# Patient Record
Sex: Female | Born: 2000 | Race: Black or African American | Hispanic: No | Marital: Single | Smoking: Never smoker
Health system: Southern US, Community
[De-identification: ages and names within clinical notes are randomized; demographics above are authoritative.]

## PROBLEM LIST (undated history)

## (undated) DIAGNOSIS — D649 Anemia, unspecified: Secondary | ICD-10-CM

## (undated) DIAGNOSIS — Z789 Other specified health status: Secondary | ICD-10-CM

## (undated) HISTORY — PX: NO PAST SURGERIES: SHX2092

## (undated) HISTORY — DX: Anemia, unspecified: D64.9

## (undated) HISTORY — DX: Other specified health status: Z78.9

---
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
## 2019-01-06 ENCOUNTER — Other Ambulatory Visit (HOSPITAL_COMMUNITY)
Admission: RE | Admit: 2019-01-06 | Discharge: 2019-01-06 | Disposition: A | Discharge status: Home / Self Care | Payer: Medicaid Other | Source: Ambulatory Visit | Attending: Obstetrics and Gynecology | Admitting: Obstetrics and Gynecology

## 2019-01-06 ENCOUNTER — Ambulatory Visit (INDEPENDENT_AMBULATORY_CARE_PROVIDER_SITE_OTHER): Payer: Medicaid Other | Admitting: Obstetrics and Gynecology

## 2019-01-06 ENCOUNTER — Encounter: Payer: Self-pay | Admitting: Obstetrics and Gynecology

## 2019-01-06 ENCOUNTER — Other Ambulatory Visit: Payer: Self-pay

## 2019-01-06 VITALS — BP 114/71 | HR 98 | Wt 207.9 lb

## 2019-01-06 DIAGNOSIS — Z148 Genetic carrier of other disease: Secondary | ICD-10-CM

## 2019-01-06 DIAGNOSIS — D509 Iron deficiency anemia, unspecified: Secondary | ICD-10-CM | POA: Diagnosis not present

## 2019-01-06 DIAGNOSIS — Z3A36 36 weeks gestation of pregnancy: Secondary | ICD-10-CM

## 2019-01-06 DIAGNOSIS — Z362 Encounter for other antenatal screening follow-up: Secondary | ICD-10-CM

## 2019-01-06 DIAGNOSIS — Z3403 Encounter for supervision of normal first pregnancy, third trimester: Secondary | ICD-10-CM | POA: Insufficient documentation

## 2019-01-06 DIAGNOSIS — O99013 Anemia complicating pregnancy, third trimester: Secondary | ICD-10-CM

## 2019-01-06 DIAGNOSIS — O0932 Supervision of pregnancy with insufficient antenatal care, second trimester: Secondary | ICD-10-CM

## 2019-01-06 DIAGNOSIS — O0933 Supervision of pregnancy with insufficient antenatal care, third trimester: Secondary | ICD-10-CM

## 2019-01-06 DIAGNOSIS — O99019 Anemia complicating pregnancy, unspecified trimester: Secondary | ICD-10-CM

## 2019-01-06 NOTE — Progress Notes (Signed)
ROB GBS 

## 2019-01-06 NOTE — Patient Instructions (Signed)
Use the following websites (and others) to help learn more about your contraception options and find the method that is right for you!  - The Centers for Disease Control (CDC) website: https://www.cdc.gov/reproductivehealth/contraception/index.htm  - [facility] website: https://www.plannedparenthood.org/learn/birth-control  - Bedsider.org: https://www.bedsider.org/methods   Go to bedsider.org for more information!  Contraception Choices Contraception, also called birth control, refers to methods or devices that prevent pregnancy. Hormonal methods Contraceptive implant A contraceptive implant is a thin, plastic tube that contains a hormone. It is inserted into the upper part of the arm. It can remain in place for up to 3 years. Progestin-only injections Progestin-only injections are injections of progestin, a synthetic form of the hormone progesterone. They are given every 3 months by a health care provider. Birth control pills Birth control pills are pills that contain hormones that prevent pregnancy. They must be taken once a day, preferably at the same time each day. Birth control patch The birth control patch contains hormones that prevent pregnancy. It is placed on the skin and must be changed once a week for three weeks and removed on the fourth week. A prescription is needed to use this method of contraception. Vaginal ring A vaginal ring contains hormones that prevent pregnancy. It is placed in the vagina for three weeks and removed on the fourth week. After that, the process is repeated with a new ring. A prescription is needed to use this method of contraception. Emergency contraceptive Emergency contraceptives prevent pregnancy after unprotected sex. They come in pill form and can be taken up to 5 days after sex. They work best the sooner they are taken after having sex. Most emergency contraceptives are available without a prescription. This method should not be used as  your only form of birth control. Barrier methods Female condom A female condom is a thin sheath that is worn over the penis during sex. Condoms keep sperm from going inside a woman's body. They can be used with a spermicide to increase their effectiveness. They should be disposed after a single use. Female condom A female condom is a soft, loose-fitting sheath that is put into the vagina before sex. The condom keeps sperm from going inside a woman's body. They should be disposed after a single use.  Intrauterine contraception Intrauterine device (IUD) An IUD is a T-shaped device that is put in a woman's uterus. There are two types:  Hormone IUD.This type contains progestin, a synthetic form of the hormone progesterone. This type can stay in place for 3-5 years.  Copper IUD.This type is wrapped in copper wire. It can stay in place for 10 years.  Permanent methods of contraception Female tubal ligation In this method, a woman's fallopian tubes are sealed, tied, or blocked during surgery to prevent eggs from traveling to the uterus.  Female sterilization This is a procedure to tie off the tubes that carry sperm (vasectomy). After the procedure, the man can still ejaculate fluid (semen).  Summary  Contraception, also called birth control, means methods or devices that prevent pregnancy.  Hormonal methods of contraception include implants, injections, pills, patches, vaginal rings, and emergency contraceptives.  Barrier methods of contraception can include female condoms, female condoms, diaphragms, cervical caps, sponges, and spermicides.  There are two types of IUDs (intrauterine devices). An IUD can be put in a woman's uterus to prevent pregnancy for 3-5 years.  Permanent sterilization can be done through a procedure for males, females, or both. This information is not intended to replace advice given to you   by your health care provider. Make sure you discuss any questions you have with your  health care provider. Document Released: 01/16/2005 Document Revised: 02/19/2016 Document Reviewed: 02/19/2016 Elsevier Interactive Patient Education  2018 Elsevier Inc.  

## 2019-01-06 NOTE — Progress Notes (Signed)
   PRENATAL VISIT NOTE  Subjective:  Brianna Mercado is a 18 y.o. G1P0 at [redacted]w[redacted]d being seen today for ongoing prenatal care.  She is currently monitored for the following issues for this low-risk pregnancy and has Supervision of normal first teen pregnancy; Iron deficiency anemia during pregnancy; Late prenatal care affecting pregnancy in second trimester; and Genetic carrier on their problem list.  Patient reports no complaints.  Contractions: Irritability. Vag. Bleeding: None.  Movement: Present. Denies leaking of fluid.   The following portions of the patient's history were reviewed and updated as appropriate: allergies, current medications, past family history, past medical history, past social history, past surgical history and problem list.   Objective:   Vitals:   01/06/19 1129  BP: 114/71  Pulse: 98  Weight: 207 lb 14.4 oz (94.3 kg)   Fetal Status: Fetal Heart Rate (bpm): 147   Movement: Present     General:  Alert, oriented and cooperative. Patient is in no acute distress.  Skin: Skin is warm and dry. No rash noted.   Cardiovascular: Normal heart rate noted  Respiratory: Normal respiratory effort, no problems with respiration noted  Abdomen: Soft, gravid, appropriate for gestational age.  Pain/Pressure: Present     Pelvic: Cervical exam deferred        Extremities: Normal range of motion.  Edema: None  Mental Status: Normal mood and affect. Normal behavior. Normal judgment and thought content.   Pt informed that the ultrasound is considered a limited OB ultrasound and is not intended to be a complete ultrasound exam.  Patient also informed that the ultrasound is not being completed with the intent of assessing for fetal or placental anomalies or any pelvic abnormalities.  Explained that the purpose of today's ultrasound is to assess for  presentation.  Patient acknowledges the purpose of the exam and the limitations of the study.    Bedside US: cephalic  Assessment and Plan:   Pregnancy: G1P0 at [redacted]w[redacted]d  1. Supervision of normal first teen pregnancy in third trimester - Strep Gp B NAA - Cervicovaginal ancillary only( [place]) - had extensive conversation regarding contraception options, gave info  2. Iron deficiency anemia during pregnancy  3. Late prenatal care affecting pregnancy in second trimester  4. Genetic carrier Carrier for [place]   Preterm labor symptoms and general obstetric precautions including but not limited to vaginal bleeding, contractions, leaking of fluid and fetal movement were reviewed in detail with the patient. Please refer to After Visit Summary for other counseling recommendations.   Return in about 1 week (around 01/13/2019) for low OB, virtual.  No future appointments.  Sloan Leiter, MD

## 2019-01-07 LAB — CERVICOVAGINAL ANCILLARY ONLY
Chlamydia: NEGATIVE
Comment: NEGATIVE
Comment: NORMAL
Neisseria Gonorrhea: NEGATIVE

## 2019-01-08 LAB — STREP GP B NAA: Strep Gp B NAA: POSITIVE — AB

## 2019-01-13 ENCOUNTER — Encounter: Payer: Self-pay | Admitting: Obstetrics and Gynecology

## 2019-01-13 ENCOUNTER — Telehealth (INDEPENDENT_AMBULATORY_CARE_PROVIDER_SITE_OTHER): Payer: Medicaid Other | Admitting: Obstetrics and Gynecology

## 2019-01-13 DIAGNOSIS — Z3A37 37 weeks gestation of pregnancy: Secondary | ICD-10-CM

## 2019-01-13 DIAGNOSIS — Z3403 Encounter for supervision of normal first pregnancy, third trimester: Secondary | ICD-10-CM

## 2019-01-13 DIAGNOSIS — O99019 Anemia complicating pregnancy, unspecified trimester: Secondary | ICD-10-CM

## 2019-01-13 DIAGNOSIS — O99013 Anemia complicating pregnancy, third trimester: Secondary | ICD-10-CM

## 2019-01-13 DIAGNOSIS — Z148 Genetic carrier of other disease: Secondary | ICD-10-CM

## 2019-01-13 DIAGNOSIS — D509 Iron deficiency anemia, unspecified: Secondary | ICD-10-CM

## 2019-01-13 NOTE — Progress Notes (Signed)
Pt is on the phone preparing for virtual visit with provider. Pt reports her BP cuff is broken and she is not able to check her BP today. Pt denies any HA's or blurry vision.

## 2019-01-13 NOTE — Progress Notes (Signed)
   TELEHEALTH OBSTETRICS PRENATAL VIRTUAL VIDEO VISIT ENCOUNTER NOTE  Provider location: [facility] for [facility] at [place]   I connected with Kathaleen Bury on 01/13/19 at  9:00 AM EST by MyChart Video Encounter at home and verified that I am speaking with the correct person using two identifiers.   I discussed the limitations, risks, security and privacy concerns of performing an evaluation and management service virtually and the availability of in person appointments. I also discussed with the patient that there may be a patient responsible charge related to this service. The patient expressed understanding and agreed to proceed. Subjective:  Brianna Mercado is a 18 y.o. G1P0 at [redacted]w[redacted]d being seen today for ongoing prenatal care.  She is currently monitored for the following issues for this low-risk pregnancy and has Supervision of normal first teen pregnancy; Iron deficiency anemia during pregnancy; Late prenatal care affecting pregnancy in second trimester; and Genetic carrier on their problem list.  Patient reports no complaints.  Contractions: Irritability. Vag. Bleeding: None.  Movement: Present. Denies any leaking of fluid.   The following portions of the patient's history were reviewed and updated as appropriate: allergies, current medications, past family history, past medical history, past social history, past surgical history and problem list.   Objective:  There were no vitals filed for this visit.  Fetal Status:     Movement: Present     General:  Alert, oriented and cooperative. Patient is in no acute distress.  Respiratory: Normal respiratory effort, no problems with respiration noted  Mental Status: Normal mood and affect. Normal behavior. Normal judgment and thought content.  Rest of physical exam deferred due to type of encounter  Imaging: No results found.  Assessment and Plan:  Pregnancy: G1P0 at [redacted]w[redacted]d 1. Supervision of normal first teen pregnancy in third  trimester Patient is doing well without complaints Patient declines contraception  2. Genetic carrier See by genetic counselor  3. Iron deficiency anemia during pregnancy Continue iron supplement  Term labor symptoms and general obstetric precautions including but not limited to vaginal bleeding, contractions, leaking of fluid and fetal movement were reviewed in detail with the patient. I discussed the assessment and treatment plan with the patient. The patient was provided an opportunity to ask questions and all were answered. The patient agreed with the plan and demonstrated an understanding of the instructions. The patient was advised to call back or seek an in-person office evaluation/go to [facility]U at [facility] for any urgent or concerning symptoms. Please refer to After Visit Summary for other counseling recommendations.   I provided 11 minutes of face-to-face time during this encounter.  No follow-ups on file.  No future appointments.  Mora Bellman, MD Center for [place]

## 2019-01-20 ENCOUNTER — Telehealth (INDEPENDENT_AMBULATORY_CARE_PROVIDER_SITE_OTHER): Payer: Medicaid Other | Admitting: Advanced Practice Midwife

## 2019-01-20 ENCOUNTER — Other Ambulatory Visit: Payer: Self-pay

## 2019-01-20 DIAGNOSIS — O99820 Streptococcus B carrier state complicating pregnancy: Secondary | ICD-10-CM

## 2019-01-20 DIAGNOSIS — Z3403 Encounter for supervision of normal first pregnancy, third trimester: Secondary | ICD-10-CM

## 2019-01-20 DIAGNOSIS — Z3A38 38 weeks gestation of pregnancy: Secondary | ICD-10-CM

## 2019-01-20 NOTE — Progress Notes (Signed)
   TELEHEALTH OBSTETRICS PRENATAL VIRTUAL VIDEO VISIT ENCOUNTER NOTE  Provider location: [facility] for [facility] at [place]   I connected with Brianna Mercado on 01/20/19 at  1:55 PM EST by MyChart Video Encounter at home and verified that I am speaking with the correct person using two identifiers.   I discussed the limitations, risks, security and privacy concerns of performing an evaluation and management service virtually and the availability of in person appointments. I also discussed with the patient that there may be a patient responsible charge related to this service. The patient expressed understanding and agreed to proceed. Subjective:  Brianna Mercado is a 18 y.o. G1P0 at [redacted]w[redacted]d being seen today for ongoing prenatal care.  She is currently monitored for the following issues for this low-risk pregnancy and has Supervision of normal first teen pregnancy; Iron deficiency anemia during pregnancy; Late prenatal care affecting pregnancy in second trimester; and Genetic carrier on their problem list.  Patient reports no complaints.  Contractions: Irritability. Vag. Bleeding: None.  Movement: Present. Denies any leaking of fluid.   The following portions of the patient's history were reviewed and updated as appropriate: allergies, current medications, past family history, past medical history, past social history, past surgical history and problem list.   Objective:  There were no vitals filed for this visit.  Fetal Status:     Movement: Present     General:  Alert, oriented and cooperative. Patient is in no acute distress.  Respiratory: Normal respiratory effort, no problems with respiration noted  Mental Status: Normal mood and affect. Normal behavior. Normal judgment and thought content.  Rest of physical exam deferred due to type of encounter  Imaging: No results found.  Assessment and Plan:  Pregnancy: G1P0 at [redacted]w[redacted]d 1. Supervision of normal first teen pregnancy in third  trimester --Pt reports good fetal movement, denies cramping, LOF, or vaginal bleeding --Anticipatory guidance about next visits/weeks of pregnancy given. --Questions answered about circumcision and obtaining breast pump --Discussed cervical ripening/labor readiness options including evening primrose oil, raspberry leaf tea, intercourse, and the [employer].  Pt states understanding. --Next visit at 39 weeks in office for exam.  Will schedule postdates IOL at that visit if needed.  2. GBS (group B Streptococcus carrier), +RV culture, currently pregnant  Term labor symptoms and general obstetric precautions including but not limited to vaginal bleeding, contractions, leaking of fluid and fetal movement were reviewed in detail with the patient. I discussed the assessment and treatment plan with the patient. The patient was provided an opportunity to ask questions and all were answered. The patient agreed with the plan and demonstrated an understanding of the instructions. The patient was advised to call back or seek an in-person office evaluation/go to MAU at [facility] for any urgent or concerning symptoms. Please refer to After Visit Summary for other counseling recommendations.   I provided 15  minutes of face-to-face time during this encounter.  No follow-ups on file.  Future Appointments  Date Time Provider [place]  01/20/2019  1:55 PM Left[place]-Kir[place], Kathie Dike, CNM [facility] None    Fatima Blank, [place] for [facility], [place]

## 2019-01-25 ENCOUNTER — Inpatient Hospital Stay (HOSPITAL_COMMUNITY): Payer: Medicaid Other | Admitting: Anesthesiology

## 2019-01-25 ENCOUNTER — Encounter (HOSPITAL_COMMUNITY): Payer: Self-pay | Admitting: Obstetrics and Gynecology

## 2019-01-25 ENCOUNTER — Inpatient Hospital Stay (HOSPITAL_COMMUNITY)
Admission: AD | Admit: 2019-01-25 | Discharge: 2019-01-28 | DRG: 807 | Disposition: A | Discharge status: Home / Self Care | Payer: Medicaid Other | Attending: Obstetrics & Gynecology | Admitting: Obstetrics & Gynecology

## 2019-01-25 ENCOUNTER — Other Ambulatory Visit: Payer: Self-pay

## 2019-01-25 DIAGNOSIS — Z3A39 39 weeks gestation of pregnancy: Secondary | ICD-10-CM

## 2019-01-25 DIAGNOSIS — O368130 Decreased fetal movements, third trimester, not applicable or unspecified: Secondary | ICD-10-CM | POA: Diagnosis present

## 2019-01-25 DIAGNOSIS — O99824 Streptococcus B carrier state complicating childbirth: Secondary | ICD-10-CM | POA: Diagnosis present

## 2019-01-25 DIAGNOSIS — Z20828 Contact with and (suspected) exposure to other viral communicable diseases: Secondary | ICD-10-CM | POA: Diagnosis present

## 2019-01-25 LAB — TYPE AND SCREEN
ABO/RH(D): AB POS
Antibody Screen: NEGATIVE

## 2019-01-25 LAB — CBC
HCT: 37 % (ref 36.0–46.0)
Hemoglobin: 11.4 g/dL — ABNORMAL LOW (ref 12.0–15.0)
MCH: 24.8 pg — ABNORMAL LOW (ref 26.0–34.0)
MCHC: 30.8 g/dL (ref 30.0–36.0)
MCV: 80.6 fL (ref 80.0–100.0)
Platelets: 292 10*3/uL (ref 150–400)
RBC: 4.59 MIL/uL (ref 3.87–5.11)
RDW: 16 % — ABNORMAL HIGH (ref 11.5–15.5)
WBC: 11.2 10*3/uL — ABNORMAL HIGH (ref 4.0–10.5)
nRBC: 0 % (ref 0.0–0.2)

## 2019-01-25 LAB — SARS CORONAVIRUS 2 (TAT 6-24 HRS): SARS Coronavirus 2: NEGATIVE

## 2019-01-25 LAB — ABO/RH: ABO/RH(D): AB POS

## 2019-01-25 LAB — RPR: RPR Ser Ql: NONREACTIVE

## 2019-01-25 MED ORDER — PHENYLEPHRINE 40 MCG/ML (10ML) SYRINGE FOR IV PUSH (FOR BLOOD PRESSURE SUPPORT)
80.0000 ug | PREFILLED_SYRINGE | INTRAVENOUS | Status: DC | PRN
  Filled 2019-01-25: qty 10

## 2019-01-25 MED ORDER — DIPHENHYDRAMINE HCL 50 MG/ML IJ SOLN: 12.5000 mg | INTRAMUSCULAR | Status: DC | PRN

## 2019-01-25 MED ORDER — TERBUTALINE SULFATE 1 MG/ML IJ SOLN: 0.2500 mg | Freq: Once | INTRAMUSCULAR | Status: DC | PRN

## 2019-01-25 MED ORDER — OXYTOCIN 40 UNITS IN NORMAL SALINE INFUSION - SIMPLE MED
1.0000 m[IU]/min | INTRAVENOUS | Status: DC
  Administered 2019-01-25: 20:00:00 2 m[IU]/min via INTRAVENOUS
  Administered 2019-01-26: 8 m[IU]/min via INTRAVENOUS
  Filled 2019-01-25: qty 1000

## 2019-01-25 MED ORDER — EPHEDRINE 5 MG/ML INJ: 10.0000 mg | INTRAVENOUS | Status: DC | PRN

## 2019-01-25 MED ORDER — LACTATED RINGERS IV SOLN
500.0000 mL | Freq: Once | INTRAVENOUS | Status: AC
  Administered 2019-01-25: 500 mL via INTRAVENOUS

## 2019-01-25 MED ORDER — OXYCODONE-ACETAMINOPHEN 5-325 MG PO TABS: 1.0000 | ORAL_TABLET | ORAL | Status: DC | PRN

## 2019-01-25 MED ORDER — PHENYLEPHRINE 40 MCG/ML (10ML) SYRINGE FOR IV PUSH (FOR BLOOD PRESSURE SUPPORT): 80.0000 ug | PREFILLED_SYRINGE | INTRAVENOUS | Status: DC | PRN

## 2019-01-25 MED ORDER — SOD CITRATE-CITRIC ACID 500-334 MG/5ML PO SOLN: 30.0000 mL | ORAL | Status: DC | PRN

## 2019-01-25 MED ORDER — LACTATED RINGERS IV SOLN
INTRAVENOUS | Status: DC
  Administered 2019-01-25: 16:00:00 125 mL/h via INTRAVENOUS

## 2019-01-25 MED ORDER — SODIUM CHLORIDE 0.9 % IV SOLN
5.0000 10*6.[IU] | Freq: Once | INTRAVENOUS | Status: AC
  Administered 2019-01-25: 12:00:00 5 10*6.[IU] via INTRAVENOUS
  Filled 2019-01-25: qty 5

## 2019-01-25 MED ORDER — SODIUM CHLORIDE (PF) 0.9 % IJ SOLN
INTRAMUSCULAR | Status: DC | PRN
  Administered 2019-01-25: 12 mL/h via EPIDURAL

## 2019-01-25 MED ORDER — ONDANSETRON HCL 4 MG/2ML IJ SOLN: 4.0000 mg | Freq: Four times a day (QID) | INTRAMUSCULAR | Status: DC | PRN

## 2019-01-25 MED ORDER — FENTANYL-BUPIVACAINE-NACL 0.5-0.125-0.9 MG/250ML-% EP SOLN
12.0000 mL/h | EPIDURAL | Status: DC | PRN
  Filled 2019-01-25: qty 250

## 2019-01-25 MED ORDER — OXYCODONE-ACETAMINOPHEN 5-325 MG PO TABS: 2.0000 | ORAL_TABLET | ORAL | Status: DC | PRN

## 2019-01-25 MED ORDER — OXYTOCIN BOLUS FROM INFUSION
500.0000 mL | Freq: Once | INTRAVENOUS | Status: AC
  Administered 2019-01-26: 04:00:00 500 mL via INTRAVENOUS

## 2019-01-25 MED ORDER — PENICILLIN G POT IN DEXTROSE 60000 UNIT/ML IV SOLN
3.0000 10*6.[IU] | INTRAVENOUS | Status: DC
  Administered 2019-01-25 (×3): 3 10*6.[IU] via INTRAVENOUS
  Filled 2019-01-25 (×3): qty 50

## 2019-01-25 MED ORDER — LACTATED RINGERS IV SOLN
500.0000 mL | INTRAVENOUS | Status: DC | PRN
  Administered 2019-01-26: 500 mL via INTRAVENOUS

## 2019-01-25 MED ORDER — LACTATED RINGERS IV SOLN: 500.0000 mL | Freq: Once | INTRAVENOUS | Status: DC

## 2019-01-25 MED ORDER — MISOPROSTOL 50MCG HALF TABLET
50.0000 ug | ORAL_TABLET | Freq: Four times a day (QID) | ORAL | Status: DC | PRN
  Administered 2019-01-25 (×2): 50 ug via BUCCAL
  Filled 2019-01-25 (×2): qty 1

## 2019-01-25 MED ORDER — FENTANYL-BUPIVACAINE-NACL 0.5-0.125-0.9 MG/250ML-% EP SOLN: 12.0000 mL/h | EPIDURAL | Status: DC | PRN

## 2019-01-25 MED ORDER — ACETAMINOPHEN 325 MG PO TABS: 650.0000 mg | ORAL_TABLET | ORAL | Status: DC | PRN

## 2019-01-25 MED ORDER — OXYTOCIN 40 UNITS IN NORMAL SALINE INFUSION - SIMPLE MED: 2.5000 [IU]/h | INTRAVENOUS | Status: DC

## 2019-01-25 MED ORDER — LIDOCAINE HCL (PF) 1 % IJ SOLN
30.0000 mL | INTRAMUSCULAR | Status: AC | PRN
  Administered 2019-01-25: 21:00:00 5 mL via SUBCUTANEOUS

## 2019-01-25 MED ORDER — FENTANYL CITRATE (PF) 100 MCG/2ML IJ SOLN
100.0000 ug | INTRAMUSCULAR | Status: DC | PRN
  Administered 2019-01-25 (×2): 100 ug via INTRAVENOUS
  Filled 2019-01-25 (×2): qty 2

## 2019-01-25 NOTE — Anesthesia Preprocedure Evaluation (Signed)
Anesthesia Evaluation  Patient identified by MRN, date of birth, ID band Patient awake    Reviewed: Allergy & Precautions, Patient's Chart, lab work & pertinent test results  Airway Mallampati: II       Dental no notable dental hx.    Pulmonary    Pulmonary exam normal        Cardiovascular negative cardio ROS Normal cardiovascular exam     Neuro/Psych negative neurological ROS  negative psych ROS   GI/Hepatic   Endo/Other    Renal/GU      Musculoskeletal   Abdominal   Peds  Hematology   Anesthesia Other Findings   Reproductive/Obstetrics (+) Pregnancy                             Anesthesia Physical Anesthesia Plan  ASA: II  Anesthesia Plan: Epidural   Post-op Pain Management:    Induction:   PONV Risk Score and Plan:   Airway Management Planned: Natural Airway  Additional Equipment:   Intra-op Plan:   Post-operative Plan:   Informed Consent: I have reviewed the patients History and Physical, chart, labs and discussed the procedure including the risks, benefits and alternatives for the proposed anesthesia with the patient or authorized representative who has indicated his/her understanding and acceptance.       Plan Discussed with:   Anesthesia Plan Comments: (Lab Results      Component                Value               Date                      WBC                      11.2 (H)            01/25/2019                HGB                      11.4 (L)            01/25/2019                HCT                      37.0                01/25/2019                MCV                      80.6                01/25/2019                PLT                      292                 01/25/2019           )        Anesthesia Quick Evaluation

## 2019-01-25 NOTE — MAU Note (Signed)
BS US performed by Aviva Kluver, Vertex presentation confirmed

## 2019-01-25 NOTE — Anesthesia Procedure Notes (Signed)
Epidural Patient location during procedure: OB Start time: 01/25/2019 8:55 PM End time: 01/25/2019 9:01 PM  Staffing Anesthesiologist: Effie Berkshire, MD Performed: anesthesiologist   Preanesthetic Checklist Completed: patient identified, IV checked, site marked, risks and benefits discussed, surgical consent, monitors and equipment checked, pre-op evaluation and timeout performed  Epidural Patient position: sitting Prep: ChloraPrep Patient monitoring: heart rate, continuous pulse ox and blood pressure Approach: midline Location: L3-L4 Injection technique: LOR saline  Needle:  Needle type: Tuohy  Needle gauge: 17 G Needle length: 9 cm Catheter type: closed end flexible Catheter size: 20 Guage Test dose: negative and 1.5% lidocaine  Assessment Events: blood not aspirated, injection not painful, no injection resistance and no paresthesia  Additional Notes LOR @ 5.5  Patient identified. Risks/Benefits/Options discussed with patient including but not limited to bleeding, infection, nerve damage, paralysis, failed block, incomplete pain control, headache, blood pressure changes, nausea, vomiting, reactions to medications, itching and postpartum back pain. Confirmed with bedside nurse the patient's most recent platelet count. Confirmed with patient that they are not currently taking any anticoagulation, have any bleeding history or any family history of bleeding disorders. Patient expressed understanding and wished to proceed. All questions were answered. Sterile technique was used throughout the entire procedure. Please see nursing notes for vital signs. Test dose was given through epidural catheter and negative prior to continuing to dose epidural or start infusion. Warning signs of high block given to the patient including shortness of breath, tingling/numbness in hands, complete motor block, or any concerning symptoms with instructions to call for help. Patient was given instructions  on fall risk and not to get out of bed. All questions and concerns addressed with instructions to call with any issues or inadequate analgesia.    Reason for block:procedure for pain

## 2019-01-25 NOTE — Progress Notes (Signed)
   Monserratt Knezevic is a 18 y.o. G1P0 at [redacted]w[redacted]d  admitted for induction of labor due to decreased fetal movements. .  Subjective: Coping well; has not had second dose of cytotec.   Objective: Vitals:   01/25/19 0945 01/25/19 1344 01/25/19 1554 01/25/19 1709  BP: 119/70 112/66 113/64 112/64  Pulse: 81 89 98 84  Resp: 18 18 16 16   Temp: 98.7 F (37.1 C) 98.8 F (37.1 C)    TempSrc: Oral Oral    SpO2:      Weight:      Height:       No intake/output data recorded.  FHT:  FHR: 140 bpm, variability: moderate,  accelerations:  Present,  decelerations:  Present occasional variables.  UC:   irregular, every 2 minutes SVE:   Dilation: 1 Effacement (%): 50 Station: -3 Exam by:: CNM Nehemie Casserly  Labs: Lab Results  Component Value Date   WBC 11.2 (H) 01/25/2019   HGB 11.4 (L) 01/25/2019   HCT 37.0 01/25/2019   MCV 80.6 01/25/2019   PLT 292 01/25/2019    Assessment / Plan: IOL in early labor; FB still in place. RN to place second dose of cytotec.   Labor: early labor Fetal Wellbeing:  Category I Pain Control:  Labor support without medications Anticipated MOD:  NSVD  Starr Lake 01/25/2019, 5:35 PM

## 2019-01-25 NOTE — MAU Note (Addendum)
Pt is G1P0 at [redacted]w[redacted]d who has been contracting every 5 mins since 0500. Denies LOF or VB, feels decreased fetal movement. Rating strongest cntx 6/10.  Denies N/V/D.

## 2019-01-25 NOTE — Progress Notes (Signed)
   Brianna Mercado is a 18 y.o. G1P0 at [redacted]w[redacted]d  admitted for induction of labor due to decreased fetal movements.   Subjective: Coping well; comfortable after FB placement and cytotec buccally.   Objective: Vitals:   01/25/19 0945 01/25/19 1344 01/25/19 1554 01/25/19 1709  BP: 119/70 112/66 113/64 112/64  Pulse: 81 89 98 84  Resp: 18 18 16 16   Temp: 98.7 F (37.1 C) 98.8 F (37.1 C)    TempSrc: Oral Oral    SpO2:      Weight:      Height:       No intake/output data recorded.  FHT:  FHR: 140 bpm, variability: moderate,  accelerations:  Present,  decelerations:  Absent UC:   none SVE:   Dilation: 1 Effacement (%): 50 Station: -3 Exam by:: CNM Khole Arterburn Pitocin @ 0 mu/min  Labs: Lab Results  Component Value Date   WBC 11.2 (H) 01/25/2019   HGB 11.4 (L) 01/25/2019   HCT 37.0 01/25/2019   MCV 80.6 01/25/2019   PLT 292 01/25/2019    Assessment / Plan:  FB placed without incident; buccal cytotec placed.    Labor: early labor Fetal Wellbeing:  Category I Pain Control:  Labor support without medications Anticipated MOD:  NSVD  Starr Lake 01/25/2019, 5:32 PM

## 2019-01-25 NOTE — H&P (Addendum)
Brianna Mercado is a 18 y.o. female presenting for contractions and decreased fetal movement. OB History    Gravida  1   Para      Term      Preterm      AB      Living        SAB      TAB      Ectopic      Multiple      Live Births             History reviewed. No pertinent past medical history. History reviewed. No pertinent surgical history. Family History: family history includes Healthy in her father and mother. Social History:  reports that she has never smoked. She has never used smokeless tobacco. She reports that she does not drink alcohol or use drugs.     Maternal Diabetes: No Genetic Screening: Normal Maternal Ultrasounds/Referrals: Normal Fetal Ultrasounds or other Referrals:  None Maternal Substance Abuse:  No Significant Maternal Medications:  None Significant Maternal Lab Results:  Group B Strep positive Other Comments:  None   Nursing Staff Provider  Office Location  [facility] Dating   LMP  Language   ENGLISH Anatomy US   normal female   Flu Vaccine   11/05/2018 Genetic Screen  NIPS:   AFP: negative   TDaP vaccine   11/05/2018 Hgb A1C or  GTT Early  Third trimester Normal  Rhogam  n/a   LAB RESULTS   Feeding Plan BOTH  Blood Type AB/Positive/-- (08/12 1601)   Contraception Declined Antibody Negative (08/12 1601)  Circumcision YES IF BOY  Rubella 1.15 (08/12 1601)  Pediatrician  UNDECIDED  RPR Non Reactive (10/06 0840)   Support Person FOB HBsAg Negative (08/12 1601)   Prenatal Classes YES  HIV Non Reactive (10/06 0840)  BTL Consent  GBS  (POSITIVE   VBAC Consent  Pap  <21yo    Hgb Electro    BP Cuff ORDERED 09/11/2018 CF     SMA     Waterbirth  [ ]  Class [ ]  Consent [ ]  CNM visit   Review of Systems Maternal Medical History:  Reason for admission: Contractions.   Contractions: Onset was 1-2 hours ago.   Frequency: regular.   Perceived severity is moderate.    Fetal activity: Perceived fetal activity is decreased.   Last  perceived fetal movement was within the past 12 hours.    Prenatal complications: no prenatal complications Prenatal Complications - Diabetes: none.    Dilation: 1 Effacement (%): 50 Station: -3 Exam by:: , RN Blood pressure 114/76, pulse 99, temperature 98.5 F (36.9 C), temperature source Oral, resp. rate 17, height 5\' 4"  (1.626 m), weight 94.9 kg, last menstrual period 04/26/2018, SpO2 99 %. Maternal Exam:  Uterine Assessment: Contraction strength is mild.  Abdomen: Patient reports no abdominal tenderness.   Fetal Exam Fetal Monitor Review: Mode: ultrasound.   Baseline rate: 150.  Variability: moderate (6-25 bpm).   Pattern: accelerations present and variable decelerations.    Fetal State Assessment: Category II - tracings are indeterminate.     Physical Exam  Nursing note and vitals reviewed. Constitutional: She is oriented to person, place, and time. She appears well-developed and well-nourished. No distress.  HENT:  Head: Normocephalic.  Cardiovascular: Normal rate.  Respiratory: Effort normal.  GI: There is no abdominal tenderness. There is no rebound.  Neurological: She is alert and oriented to person, place, and time.  Skin: Skin is warm and dry.  Psychiatric: She has a normal mood and affect.     Pt informed that the ultrasound is considered a limited OB ultrasound and is not intended to be a complete ultrasound exam.  Patient also informed that the ultrasound is not being completed with the intent of assessing for fetal or placental anomalies or any pelvic abnormalities.  Explained that the purpose of today's ultrasound is to assess for  presentation.  Patient acknowledges the purpose of the exam and the limitations of the study.    VERTEX    Prenatal labs: ABO, Rh: AB/Positive/-- (08/12 1601) Antibody: Negative (08/12 1601) Rubella: 1.15 (08/12 1601) RPR: Non Reactive (10/06 0840)  HBsAg: Negative (08/12 1601)  HIV: Non Reactive (10/06  0840)  GBS: --/Positive (12/07 1255)   Assessment/Plan: 18 y.o. G1P0 at [redacted]w[redacted]d  Decreased fetal movement Early labor Admit to labor and delivery    Marcille Buffy DNP, CNM  01/25/19  8:36 AM

## 2019-01-25 NOTE — Progress Notes (Addendum)
Brianna Mercado is a 18 y.o. G1P0 at [redacted]w[redacted]d admitted for induction of labor due to decreased fetal movements.  Subjective: Contractions feeling more painful.  Objective: BP 116/71   Pulse 88   Temp 99.1 F (37.3 C) (Oral)   Resp 18   Ht 5\' 4"  (1.626 m)   Wt 94.9 kg   LMP 04/26/2018   SpO2 99% Comment: room air  BMI 35.93 kg/m  No intake/output data recorded.  FHT:  FHR: 145 bpm, variability: moderate,  accelerations:  Present,  decelerations:  Present variables UC:   regular, every 3-4 minutes  SVE:   Dilation: 7 Effacement (%): 70 Station: -2 Exam by:: Brianna Hill RN  Labs: Lab Results  Component Value Date   WBC 11.2 (H) 01/25/2019   HGB 11.4 (L) 01/25/2019   HCT 37.0 01/25/2019   MCV 80.6 01/25/2019   PLT 292 01/25/2019    Assessment / Plan: 18 yo G1P0 at 39.1 EGA admitted for IOL 2/2 decreased fetal movement  Labor: S/p FB and cyto x2. SROM with removal of FB @1735 . Pitocin to start now. Fetal Wellbeing:  Category I Pain Control:  desires epidural I/D:  GBS positive, on PCN Anticipated MOD:  vaginal  Brianna Mercado L Brianna Tatem DO OB Fellow, Faculty Practice 01/25/2019, 8:26 PM

## 2019-01-26 ENCOUNTER — Encounter (HOSPITAL_COMMUNITY): Payer: Self-pay | Admitting: Obstetrics and Gynecology

## 2019-01-26 DIAGNOSIS — O368130 Decreased fetal movements, third trimester, not applicable or unspecified: Secondary | ICD-10-CM

## 2019-01-26 DIAGNOSIS — O99824 Streptococcus B carrier state complicating childbirth: Secondary | ICD-10-CM

## 2019-01-26 DIAGNOSIS — Z3A39 39 weeks gestation of pregnancy: Secondary | ICD-10-CM

## 2019-01-26 LAB — CBC
HCT: 32.9 % — ABNORMAL LOW (ref 36.0–46.0)
Hemoglobin: 10.2 g/dL — ABNORMAL LOW (ref 12.0–15.0)
MCH: 24.8 pg — ABNORMAL LOW (ref 26.0–34.0)
MCHC: 31 g/dL (ref 30.0–36.0)
MCV: 79.9 fL — ABNORMAL LOW (ref 80.0–100.0)
Platelets: 286 10*3/uL (ref 150–400)
RBC: 4.12 MIL/uL (ref 3.87–5.11)
RDW: 15.9 % — ABNORMAL HIGH (ref 11.5–15.5)
WBC: 18 10*3/uL — ABNORMAL HIGH (ref 4.0–10.5)
nRBC: 0 % (ref 0.0–0.2)

## 2019-01-26 MED ORDER — DIBUCAINE (PERIANAL) 1 % EX OINT: 1.0000 "application " | TOPICAL_OINTMENT | CUTANEOUS | Status: DC | PRN

## 2019-01-26 MED ORDER — DIPHENHYDRAMINE HCL 25 MG PO CAPS: 25.0000 mg | ORAL_CAPSULE | Freq: Four times a day (QID) | ORAL | Status: DC | PRN

## 2019-01-26 MED ORDER — WITCH HAZEL-GLYCERIN EX PADS: 1.0000 "application " | MEDICATED_PAD | CUTANEOUS | Status: DC | PRN

## 2019-01-26 MED ORDER — ACETAMINOPHEN 325 MG PO TABS: 650.0000 mg | ORAL_TABLET | ORAL | Status: DC | PRN

## 2019-01-26 MED ORDER — SENNOSIDES-DOCUSATE SODIUM 8.6-50 MG PO TABS
2.0000 | ORAL_TABLET | ORAL | Status: DC
  Administered 2019-01-26 – 2019-01-27 (×2): 2 via ORAL
  Filled 2019-01-26 (×2): qty 2

## 2019-01-26 MED ORDER — BENZOCAINE-MENTHOL 20-0.5 % EX AERO
1.0000 "application " | INHALATION_SPRAY | CUTANEOUS | Status: DC | PRN
  Administered 2019-01-27 – 2019-01-28 (×2): 1 via TOPICAL
  Filled 2019-01-26 (×2): qty 56

## 2019-01-26 MED ORDER — ONDANSETRON HCL 4 MG PO TABS: 4.0000 mg | ORAL_TABLET | ORAL | Status: DC | PRN

## 2019-01-26 MED ORDER — SIMETHICONE 80 MG PO CHEW: 80.0000 mg | CHEWABLE_TABLET | ORAL | Status: DC | PRN

## 2019-01-26 MED ORDER — PRENATAL MULTIVITAMIN CH
1.0000 | ORAL_TABLET | Freq: Every day | ORAL | Status: DC
  Administered 2019-01-26 – 2019-01-27 (×2): 1 via ORAL
  Filled 2019-01-26 (×2): qty 1

## 2019-01-26 MED ORDER — ONDANSETRON HCL 4 MG/2ML IJ SOLN: 4.0000 mg | INTRAMUSCULAR | Status: DC | PRN

## 2019-01-26 MED ORDER — TETANUS-DIPHTH-ACELL PERTUSSIS 5-2.5-18.5 LF-MCG/0.5 IM SUSP: 0.5000 mL | Freq: Once | INTRAMUSCULAR | Status: DC

## 2019-01-26 MED ORDER — COCONUT OIL OIL
1.0000 "application " | TOPICAL_OIL | Status: DC | PRN
  Administered 2019-01-26 – 2019-01-28 (×2): 1 via TOPICAL

## 2019-01-26 MED ORDER — IBUPROFEN 600 MG PO TABS
600.0000 mg | ORAL_TABLET | Freq: Four times a day (QID) | ORAL | Status: DC
  Administered 2019-01-26 – 2019-01-28 (×8): 600 mg via ORAL
  Filled 2019-01-26 (×8): qty 1

## 2019-01-26 MED ORDER — LACTATED RINGERS AMNIOINFUSION: INTRAVENOUS | Status: DC

## 2019-01-26 MED ORDER — LIDOCAINE HCL (PF) 1 % IJ SOLN
INTRAMUSCULAR | Status: AC
  Administered 2019-01-26: 04:00:00 30 mL
  Filled 2019-01-26: qty 30

## 2019-01-26 MED ORDER — ZOLPIDEM TARTRATE 5 MG PO TABS: 5.0000 mg | ORAL_TABLET | Freq: Every evening | ORAL | Status: DC | PRN

## 2019-01-26 NOTE — Progress Notes (Signed)
Called to room due to prolonged decel, ?baseline change.  Cervical exam does not show much change.  Strip review: 150 bpm baseline, moderate variability, 15x15 accels, variables MVUs 200, barely adequate  Plan to start amnioinfusion. Continue with pitocin. Discussed method of delivery with patient as cervical exam has not changed much over the last few hours.  Merilyn Baba, DO OB Fellow, Faculty Practice 01/26/2019 1:56 AM

## 2019-01-26 NOTE — Discharge Summary (Signed)
Postpartum Discharge Summary     Patient Name: Brianna Mercado DOB: [birth year] MRN: [id number]  Date of admission: 01/25/2019 Delivering Provider: Merilyn Baba   Date of discharge: 01/28/2019  Admitting diagnosis: Normal labor [O80, Z37.9] Intrauterine pregnancy: [redacted]w[redacted]d    Secondary diagnosis:  Active Problems:   Normal labor  Additional problems: None     Discharge diagnosis: Term Pregnancy Delivered                                                                                                Post partum procedures:None  Augmentation: Pitocin, Cytotec and Foley Balloon  Complications: None  Hospital course:  Induction of Labor With Vaginal Delivery   18y.o. yo G1P0 at 373w2das admitted to the hospital 01/25/2019 for induction of labor.  Indication for induction: decreased fetal movement.  Patient had an uncomplicated labor course as follows:  Patient was admitted in early labor for contractions and decreased fetal movement with a Cat II tracing. A FB was placed and she was given cytotec for cervical ripening. She SROMed after FB came out and was started on pitocin. She progressed to complete and started pushing shortly after.  Membrane Rupture Time/Date: 5:35 PM ,01/25/2019   Intrapartum Procedures: Episiotomy: None [1]                                         Lacerations:  2nd degree [3];Perineal [11]  Patient had delivery of a Viable infant.  Information for the patient's newborn:  JoNakhia, Levitan0[[id number]]Delivery Method: Vag-Spont    01/26/2019  Details of delivery can be found in separate delivery note.  Patient had a routine postpartum course. Patient is discharged home 01/28/19. Delivery time: 3:50 AM    Magnesium Sulfate received: No BMZ received: No Rhophylac:No MMR:N/A Transfusion:No  Physical exam  Vitals:   01/26/19 2127 01/27/19 0510 01/27/19 1512 01/28/19 0204  BP: 115/74 105/72 114/70 118/77  Pulse: 91 86 83 76  Resp: '16 18  16 18  '$ Temp: 98 F (36.7 C) 98.5 F (36.9 C) 98.8 F (37.1 C) 98.5 F (36.9 C)  TempSrc: Oral Oral Oral Oral  SpO2: 100% 99% 100% 100%  Weight:      Height:       General: alert, cooperative and no distress Lochia: appropriate Uterine Fundus: firm Incision: N/A DVT Evaluation: No evidence of DVT seen on physical exam. Labs: Lab Results  Component Value Date   WBC 18.0 (H) 01/26/2019   HGB 10.2 (L) 01/26/2019   HCT 32.9 (L) 01/26/2019   MCV 79.9 (L) 01/26/2019   PLT 286 01/26/2019   No flowsheet data found.  Discharge instruction: per After Visit Summary and "Baby and Me Booklet".  After visit meds:  Allergies as of 01/28/2019   No Known Allergies     Medication List    TAKE these medications   acetaminophen 325 MG tablet Commonly known as: Tylenol Take 2 tablets (650 mg total) by mouth every 4 (four)  hours as needed (for pain scale < 4).   Blood Pressure Kit Devi 1 Device by Does not apply route as needed.   Concept DHA 53.5-38-1 MG Caps Take 1 tablet by mouth daily.   ferrous sulfate 325 (65 FE) MG tablet Commonly known as: FerrouSul Take 1 tablet (325 mg total) by mouth 2 (two) times daily.   ibuprofen 600 MG tablet Commonly known as: ADVIL Take 1 tablet (600 mg total) by mouth every 6 (six) hours.       Diet: routine diet  Activity: Advance as tolerated. Pelvic rest for 6 weeks.   Outpatient follow up:4 weeks Follow up Appt: Future Appointments  Date Time Provider [place]  02/24/2019  9:15 AM Leftwich-Kirby, Kathie Dike, CNM [facility] None   Follow up Visit: Please schedule this patient for Postpartum visit in: 4 weeks with the following provider: Any provider  For C/S patients schedule nurse incision check in weeks 2 weeks: no  Low risk pregnancy complicated by: decreased fetal movement  Delivery mode: SVD  Anticipated Birth Control: other/unsure  PP Procedures needed: None  Schedule Integrated BH visit: no   Newborn Data: Live  born female  Birth Weight:  3415 g APGAR: 1, 9  Newborn Delivery   Birth date/time: 01/26/2019 03:50:00 Delivery type: Vaginal, Spontaneous      Baby Feeding: Breast Disposition:home with mother   01/28/2019 Merilyn Baba, DO

## 2019-01-26 NOTE — Discharge Instructions (Signed)

## 2019-01-26 NOTE — Lactation Note (Signed)
This note was copied from a baby's chart. Lactation Consultation Note  Patient Name: Brianna Mercado IRCVE'L Date: 01/26/2019 Reason for consult: Initial assessment;Term;Primapara Newborn is 33 hours old and has been to the breast twice.  Mom reports that baby latches easily.  Latch score 8.  Mom asking about a breast pump.  A manual pump given with instructions on use, cleaning and EBM storage.  Discussed milk coming to volume.  Instructed to feed with any cue and call for assist prn.  Breastfeeding consultation services information given and reviewed.  Maternal Data    Feeding    LATCH Score                   Interventions Interventions: Hand pump  Lactation Tools Discussed/Used     Consult Status Consult Status: Follow-up Date: 01/27/19 Follow-up type: In-patient    Ave Filter 01/26/2019, 9:03 AM

## 2019-01-26 NOTE — Progress Notes (Addendum)
Sylva Overley is a 18 y.o. G1P0 at [redacted]w[redacted]d admitted for induction of labor due todecreased fetal movements.  Subjective: Comfortable with epidural  Objective: BP (!) 117/46   Pulse (!) 117   Temp 99.1 F (37.3 C) (Oral)   Resp 18   Ht 5\' 4"  (1.626 m)   Wt 94.9 kg   LMP 04/26/2018   SpO2 100%   BMI 35.93 kg/m  Total I/O In: 22.6 [I.V.:22.6] Out: -   FHT:  FHR: 135 bpm, variability: moderate,  accelerations:  Present,  decelerations:  Present variables UC:   regular, every 2-3 minutes  SVE:   Dilation: 7 Effacement (%): 80 Station: -2 Exam by:: J Mbugua RN   Pitocin @ 8 mu/min  Labs: Lab Results  Component Value Date   WBC 11.2 (H) 01/25/2019   HGB 11.4 (L) 01/25/2019   HCT 37.0 01/25/2019   MCV 80.6 01/25/2019   PLT 292 01/25/2019    Assessment / Plan: 18 yo G1P0 at 39.1 EGA admitted for IOL 2/2 decreased fetal movement  Labor: S/p FB and cyto x2. SROM with removal of FB @1735 . Pitocin at 8. No change since last check. IUPC placed at this time. Baby feels ROP on exam, will use peanut to promote better positioning. Due to difficulty distinguishing maternal and fetal heart rate, FSE placed at this check. Fetal Wellbeing:  Category II, reassuring for moderate variability. Pain Control:  desires epidural I/D:  GBS positive, on PCN Anticipated MOD:  vaginal  Amica Harron L Yuya Vanwingerden DO OB Fellow, Faculty Practice 01/26/2019, 12:13 AM

## 2019-01-26 NOTE — Anesthesia Postprocedure Evaluation (Signed)
Anesthesia Post Note  Patient: Brianna Mercado  Procedure(s) Performed: AN AD [place]     Patient location during evaluation: Mother Baby Anesthesia Type: Epidural Level of consciousness: awake and alert Pain management: pain level controlled Vital Signs Assessment: post-procedure vital signs reviewed and stable Respiratory status: spontaneous breathing, nonlabored ventilation and respiratory function stable Cardiovascular status: stable Postop Assessment: no headache, no backache and epidural receding Anesthetic complications: no    Last Vitals:  Vitals:   01/26/19 0705 01/26/19 1045  BP: 118/60 112/69  Pulse: 79 98  Resp: 18 18  Temp: 36.7 C 37.3 C  SpO2: 100% 99%    Last Pain:  Vitals:   01/26/19 1145  TempSrc:   PainSc: 0-No pain   Pain Goal:                   [employer]

## 2019-01-26 NOTE — Lactation Note (Signed)
This note was copied from a baby's chart. Lactation Consultation Note  Patient Name: Brianna Mercado HUTML'Y Date: 01/26/2019 Reason for consult: Mother's request;Follow-up assessment;Primapara;1st time breastfeeding;Term  1539 - 1552 - I followed up with [name] upon request. She reports that her 72 hour old son latched a few times this afternoon, but each feeding lasted approximately 1-2 minutes, and then he fell asleep. He was on her chest upon entry, and he appeared to be rooting.  I assisted [name] with latching baby to her right breast in football hold. I instructed her to hand express colostrum first, which she was able to do proficiently. Baby latched readily, and I observed and assisted her with the feeding for about 10 minutes; he continued to be on the breast upon entry. Rhythmic suckling sequences noted, and I taught [name] how to gently pester baby to keep him active on the breast.  I educated at the bedside on infant feeding patterns, output expectations, positioning, feeding cues, and cluster feeding. [name] support person asked questions about the benefits of breast milk. I reviewed infant stomach size and when to expect mature milk to transition.   No further questions at this time.    Maternal Data Has patient been taught Hand Expression?: Yes Does the patient have breastfeeding experience prior to this delivery?: No  Feeding Feeding Type: Breast Fed  LATCH Score Latch: Grasps breast easily, tongue down, lips flanged, rhythmical sucking.  Audible Swallowing: A few with stimulation  Type of Nipple: Everted at rest and after stimulation  Comfort (Breast/Nipple): Soft / non-tender  Hold (Positioning): Assistance needed to correctly position infant at breast and maintain latch.  LATCH Score: 8  Interventions Interventions: Assisted with latch;Breast feeding basics reviewed;Hand express;Breast compression;Support pillows   Consult  Status Consult Status: Follow-up Date: 01/27/19 Follow-up type: In-patient    [name] 01/26/2019, 4:05 PM

## 2019-01-27 NOTE — Lactation Note (Signed)
This note was copied from a baby's chart. Lactation Consultation Note  Patient Name: Brianna Mercado KYHCW'C Date: 01/27/2019 Reason for consult: Follow-up assessment;Mother's request  76 - [phone] - Brianna Mercado paged me for latch observation. I assisted with latching Brianna Mercado to her right breast in football hold. Baby latches readily with strong jaw movement. Brianna Mercado states pain with latch that subsides after a few minutes. Her nipples are in tact but very sensitive/tender.  Prior to latching, I allowed baby to suckle on my gloved finger. I noted some disorganization and bunching of the tongue. I'm not sure if baby is transferring well at the breast given his long feeds and suck. Some swallows noted when on the breast, but not rhythmic.  We discussed supplementation via donor milk post feedings to ensure adequate intake this evening. Mom and dad receptive. Recommend limiting feedings to about 30 minutes to give breasts/nipples a breast and post hand expression and supplementation of EBM and donor milk. Recommend up to 10 mls post feedings. Feed via cup or curved tip syringe.  I reviewed plan with RN. Brianna Mercado has been counseled on using a DEBP. At this time, she has declined; I encouraged her to call if she changes her mind.  Brianna Mercado plans to apply comfort gels post feeding.  Maternal Data Formula Feeding for Exclusion: No Has patient been taught Hand Expression?: Yes Does the patient have breastfeeding experience prior to this delivery?: No  Feeding Feeding Type: Breast Fed  LATCH Score Latch: Grasps breast easily, tongue down, lips flanged, rhythmical sucking.  Audible Swallowing: A few with stimulation  Type of Nipple: Everted at rest and after stimulation  Comfort (Breast/Nipple): Filling, red/small blisters or bruises, mild/mod discomfort  Hold (Positioning): No assistance needed to correctly position infant at breast.  LATCH Score:  8  Interventions Interventions: Breast feeding basics reviewed;Skin to skin;Hand express;Breast compression;Support pillows  Lactation Tools Discussed/Used Tools: Other (comment);Feeding cup(manual pump; colostrum containers) Pump Review: Setup, frequency, and cleaning;Milk Storage   Consult Status Consult Status: Follow-up Date: 01/28/19 Follow-up type: In-patient    Brianna Mercado 01/27/2019, 6:42 PM

## 2019-01-27 NOTE — Lactation Note (Signed)
This note was copied from a baby's chart. Lactation Consultation Note  Patient Name: Brianna Mercado [id number] Date: 01/27/2019 Reason for consult: Follow-up assessment  Brianna Mercado paged for her donor milk. When I entered the room, she stated that her RN provided her with some donor milkmilk. Baby took approximately 24 mls by bottle and was asleep. I reviewed the feeding plan and recommended breast feeding first each time and supplementing 10-12 following. I showed her how to warm her donor milk by removing what she needed with the syringe) and to save the rest in her refrigerator. I also provided her with a curved tip syringe and showed her how to finger feed baby. I suggested that next time she finger feed baby to allow him to practice his sucking skills.   Brianna Mercado verbalized understanding.   Lactation Tools Discussed/Used Tools: Bottle   Consult Status Consult Status: Follow-up Date: 01/28/19 Follow-up type: In-patient    [name] 01/27/2019, 11:21 PM

## 2019-01-27 NOTE — Lactation Note (Signed)
This note was copied from a baby's chart. Lactation Consultation Note  Patient Name: Brianna Mercado AJOIN'O Date: 01/27/2019 Reason for consult: Follow-up assessment;Primapara;1st time breastfeeding;Term  1606 - 1625 - I followed up with Brianna Mercado to check on her progress with breast feeding. She reports that baby has been having long feeding sessions (60 minutes). Brianna Mercado has been breast feeding on demand every 2-3 hours. Her nipples are in tact, but tender.  I discussed infant weight loss of 6% at 24 hours. I recommended that she limit feedings to approximately 30 minutes and to call [name] for feeding assistance this evening. Baby was sleeping in the bassinet at the time of this visit, and she commented that she did not want him to wake up. She just received her meal. I recommended that [name] observe feeding and assess for infant transfer at the breast.  I also suggested that she post-pump and provide her EBM back to baby. We reviewed hand expression, and Brianna Mercado is adept at this. She has a generous amount of colostrum. I helped her hand express colostrum into a foley cup and recommended that she feed that to baby when he wakes up (breast feed first). She states that her manual pump is painful with size 27 flanges. She is extremely sensitive around her nipples, likely due to such long feedings. She prefers hand expression over using her manual pump.  I provided comfort gels, and she already has coconut oil. I offered to set up a DEBP if she'd like to see if this is more comfortable. She verbalized understanding and will call tonight when ready for further assistance.  Plan is to breast feed on demand 8-12 times a day. Post-pump and/or hand express 5-10 minutes each breast and provide EBM back to baby with each feeding.  Maternal Data Formula Feeding for Exclusion: No Has patient been taught Hand Expression?: Yes Does the patient have breastfeeding experience prior to this delivery?:  No   Interventions Interventions: Breast feeding basics reviewed;Hand express;Hand pump  Lactation Tools Discussed/Used Tools: Other (comment);Feeding cup(manual pump; colostrum containers) Pump Review: Setup, frequency, and cleaning;Milk Storage   Consult Status Consult Status: Follow-up Date: 01/28/19 Follow-up type: In-patient    [name] 01/27/2019, 5:01 PM

## 2019-01-27 NOTE — Progress Notes (Signed)
POSTPARTUM PROGRESS NOTE  Subjective: Brianna Mercado is a 18 y.o. G1P1001 PPD#1 s/p vaginal delivery at [redacted]w[redacted]d.  She reports she doing well. No acute events overnight. She denies any problems with ambulating, voiding or po intake. Denies nausea or vomiting. She has passed flatus. Pain is well controlled.  Lochia is appropriate.  Objective: Blood pressure 105/72, pulse 86, temperature 98.5 F (36.9 C), temperature source Oral, resp. rate 18, height 5\' 4"  (1.626 m), weight 94.9 kg, last menstrual period 04/26/2018, SpO2 99 %, unknown if currently breastfeeding.  Physical Exam:  General: alert, cooperative and no distress Chest: no respiratory distress Abdomen: soft, non-tender  Uterine Fundus: firm, appropriately tender Extremities: No calf swelling or tenderness  No edema  Recent Labs    01/25/19 0915 01/26/19 0659  HGB 11.4* 10.2*  HCT 37.0 32.9*    Assessment/Plan: Brianna Mercado is a 18 y.o. G1P1001 PPD#1 s/p vaginal delivery at [redacted]w[redacted]d.  Routine Postpartum Care: Doing well, pain well-controlled.  -- Continue routine care, lactation support  -- Contraception: declines -- Feeding: breast  Dispo: Plan for discharge tomorrow.  Merilyn Baba, DO OB/GYN Fellow, [facility] for [facility]

## 2019-01-28 MED ORDER — IBUPROFEN 600 MG PO TABS: 600.0000 mg | ORAL_TABLET | Freq: Four times a day (QID) | ORAL | 0 refills | Status: DC

## 2019-01-28 MED ORDER — ACETAMINOPHEN 325 MG PO TABS: 650.0000 mg | ORAL_TABLET | ORAL | 0 refills | Status: DC | PRN

## 2019-01-28 NOTE — Lactation Note (Signed)
This note was copied from a baby's chart. Lactation Consultation Note  Patient Name: Brianna Mercado [id number] Date: 01/28/2019 Reason for consult: Follow-up assessment Baby is 52 hours old/7% weight loss.  Mom states she feels initial latch on pain that improves with feeding.  Baby has received donor milk x 1.  Discussed milk coming to volume and the prevention and treatment of engorgement.  Mom has a manual pump for prn use.  Questions answered.  Reviewed lactation outpatient services and encouraged to call prn.  Maternal Data    Feeding Feeding Type: Breast Fed  LATCH Score                   Interventions    Lactation Tools Discussed/Used     Consult Status Consult Status: Complete Follow-up type: Call as needed    Ave Filter 01/28/2019, 8:33 AM

## 2019-01-28 NOTE — Progress Notes (Signed)
Discharge instructions given to patient. Discussed follow up appointment time and medication changes.  Reviewed postpartum care. Patient verbalized understanding.

## 2019-01-29 ENCOUNTER — Encounter: Payer: Medicaid Other | Admitting: Medical

## 2019-02-24 ENCOUNTER — Ambulatory Visit (INDEPENDENT_AMBULATORY_CARE_PROVIDER_SITE_OTHER): Payer: Medicaid Other | Admitting: Advanced Practice Midwife

## 2019-02-24 ENCOUNTER — Other Ambulatory Visit: Payer: Self-pay

## 2019-02-24 DIAGNOSIS — Z1389 Encounter for screening for other disorder: Secondary | ICD-10-CM | POA: Diagnosis not present

## 2019-02-24 DIAGNOSIS — Z3009 Encounter for other general counseling and advice on contraception: Secondary | ICD-10-CM

## 2019-02-24 NOTE — Progress Notes (Signed)
Post Partum Exam  Brianna Mercado is a 19 y.o. G20P1001 female who presents for a postpartum visit. She is 4 weeks postpartum following a spontaneous vaginal delivery. I have fully reviewed the prenatal and intrapartum course. The delivery was at 39 gestational weeks.  Anesthesia: epidural. Postpartum course has been uncomplicated. Baby's course has been uncomplicated. Baby is feeding by breast. No bleeding. Bowel function is normal. Bladder function is normal. Patient is not sexually active. Contraception method is condoms. Postpartum depression screening:neg. Patient denies any burning, pain, bleeding from laceration repairs.   The following portions of the patient's history were reviewed and updated as appropriate: allergies, current medications, past family history, past medical history, past social history, past surgical history and problem list. Last pap smear done (N/A), patient is 19 years old.  Review of Systems Pertinent items noted in HPI and remainder of comprehensive ROS otherwise negative.    Objective:  Last menstrual period 04/26/2018, unknown if currently breastfeeding. VS reviewed, nursing note reviewed  Constitutional: well developed, well nourished, no distress HEENT: normocephalic CV: normal rate Pulm/chest wall: normal effort Abdomen: soft Neuro: alert and oriented x 3 Skin: warm, dry Psych: affect normal GU: deferred   Assessment:  Normal postpartum exam. Pap smear not done at today's visit given patient's age.   Plan:   1. Contraception: condoms 2. Follow up in: 1 year for annual exam or as needed.   Alessandra Grout, MS3

## 2019-02-24 NOTE — Progress Notes (Deleted)
Post Partum Exam  Brianna Mercado is a 19 y.o. G7P1001 female who presents for a postpartum visit. She is 4 weeks postpartum following a spontaneous vaginal delivery. I have fully reviewed the prenatal and intrapartum course. The delivery was at 39 gestational weeks.  Anesthesia: epidural. Postpartum course has been ***. Baby's course has been ***. Baby is feeding by breast. Bleeding no bleeding. Bowel function is normal. Bladder function is normal. Patient is not sexually active. Contraception method is condoms. Postpartum depression screening:neg  {Common ambulatory SmartLinks:19316} Last pap smear done (N/A), patient is 19 years old.  Review of Systems {ros; complete:30496}    Objective:  Last menstrual period 04/26/2018, unknown if currently breastfeeding.  General:  {gen appearance:16600}   Breasts:  {breast exam:1202::"inspection negative, no nipple discharge or bleeding, no masses or nodularity palpable"}  Lungs: {lung exam:16931}  Heart:  {heart exam:5510}  Abdomen: {abdomen exam:16834}   Vulva:  {labia exam:12198}  Vagina: {vagina exam:12200}  Cervix:  {cervix exam:14595}  Corpus: {uterus exam:12215}  Adnexa:  {adnexa exam:12223}  Rectal Exam: {rectal/vaginal exam:12274}        Assessment:    *** postpartum exam. Pap smear {done:10129} at today's visit.   Plan:   1. Contraception: {method:5051} 2. *** 3. Follow up in: {1-10:13787} {time; units:19136} or as needed.

## 2019-03-14 ENCOUNTER — Telehealth (INDEPENDENT_AMBULATORY_CARE_PROVIDER_SITE_OTHER): Payer: Medicaid Other | Admitting: Licensed Clinical Social Worker

## 2019-03-14 ENCOUNTER — Encounter: Payer: Self-pay | Admitting: *Deleted

## 2019-03-14 ENCOUNTER — Other Ambulatory Visit: Payer: Self-pay

## 2019-03-14 DIAGNOSIS — F53 Postpartum depression: Secondary | ICD-10-CM

## 2019-03-14 DIAGNOSIS — O99345 Other mental disorders complicating the puerperium: Secondary | ICD-10-CM

## 2019-03-14 NOTE — BH Specialist Note (Signed)
Integrated Behavioral Health Initial Visit  MRN: [id number] Name: Brianna Mercado  Number of Integrated Behavioral Health Clinician visits:: 1 Session Start time: 10:00am  Session End time: 10:30pm Total time: 30 mins  Type of Service: Integrated Behavioral Health- Individual Interpretor:no  Interpretor Name and Language: None   Warm Hand Off Completed.       SUBJECTIVE: Brianna Mercado is a 19 y.o. female  Patient was referred by Loyce Dys for Postpartum depression . Patient reports the following symptoms sadness, crying, difficulty sleeping, Duration of problem: 2-3 weeks ; Severity of problem: Mild   OBJECTIVE: Mood: Good  and Affect: Normal  Risk of harm to self or others: Patient denies all risk of harm   LIFE CONTEXT: Family and Social: Lives with boyfriend and newborn son. Reports does not have a good relationship with her mother.  School/Work: Full time Personnel officer in [employer]  Self-Care: None  Life Changes: Moved into new apartment   GOALS ADDRESSED: Patient will: 1. Reduce symptoms of: sadness, crying and decrease sleep  2. Increase knowledge of postpartum depression   3. Demonstrate ability to: self manage symptoms   INTERVENTIONS: Interventions utilized: psychotherapy  Standardized Assessments completed: None  ASSESSMENT: Patient currently experiencing postpartum depression    Patient may benefit from letter to school to extend school assignments. Techniques to self manage symptoms. Will send message to provider [place] to monitor for addition symptoms requiring medication.   PLAN: 1. Follow up with behavioral health clinician on : two weeks  2. Behavioral recommendations: psychotherapy 3. Referral(s): none 4. "From scale of 1-10, how likely are you to follow plan?":   Gwyndolyn Saxon, LCSW

## 2019-03-14 NOTE — Progress Notes (Signed)
Subjective: Brianna Mercado is a G1P1001 who presents to the [facility] today for postpartum depression.  She does not have a history of any mental health concerns. She is currently sexually active. She is currently using no method for birth control.   LMP 04/26/2018   Birth Control History:  None   MDM Patient counseled on all options for birth control today including LARC. Patient desires no method initiated for birth control.   Assessment:  19 y.o. female considering no method  for birth control  Plan: No further plan   Gwyndolyn Saxon, [place] 03/14/2019 11:11 AM

## 2019-07-31 ENCOUNTER — Encounter: Payer: Self-pay | Admitting: Obstetrics

## 2019-07-31 ENCOUNTER — Telehealth (INDEPENDENT_AMBULATORY_CARE_PROVIDER_SITE_OTHER): Payer: Medicaid Other | Admitting: Obstetrics

## 2019-07-31 DIAGNOSIS — Z30013 Encounter for initial prescription of injectable contraceptive: Secondary | ICD-10-CM

## 2019-07-31 DIAGNOSIS — Z3009 Encounter for other general counseling and advice on contraception: Secondary | ICD-10-CM | POA: Diagnosis not present

## 2019-07-31 MED ORDER — MEDROXYPROGESTERONE ACETATE 150 MG/ML IM SUSP: 150.0000 mg | INTRAMUSCULAR | 4 refills | Status: DC

## 2019-07-31 NOTE — Progress Notes (Signed)
° ° °  GYNECOLOGY VIRTUAL VISIT ENCOUNTER NOTE  Provider location: [facility] [place]   I connected with Brianna Mercado on 07/31/19 at 10:45 AM EDT by MyChart Video Encounter at home and verified that I am speaking with the correct person using two identifiers.   I discussed the limitations, risks, security and privacy concerns of performing an evaluation and management service virtually and the availability of in person appointments. I also discussed with the patient that there may be a patient responsible charge related to this service. The patient expressed understanding and agreed to proceed.   History:  Brianna Mercado is a 19 y.o. G30P1001 female being evaluated today for counseling and advice for contraception. She denies any abnormal vaginal discharge, bleeding, pelvic pain or other concerns.       History reviewed. No pertinent past medical history. History reviewed. No pertinent surgical history. The following portions of the patient's history were reviewed and updated as appropriate: allergies, Mercado medications, past family history, past medical history, past social history, past surgical history and problem list.   Health Maintenance:  No health related problems, except obesity.  Review of Systems:  Pertinent items noted in HPI and remainder of comprehensive ROS otherwise negative.  Physical Exam:   General:  Alert, oriented and cooperative. Patient appears to be in no acute distress.  Mental Status: Normal mood and affect. Normal behavior. Normal judgment and thought content.   Respiratory: Normal respiratory effort, no problems with respiration noted  Rest of physical exam deferred due to type of encounter  Labs and Imaging No results found for this or any previous visit (from the past 336 hour(s)). No results found.     Assessment and Plan:     1. Encounter for other general counseling or advice on contraception - wants to start Depo Provera  injections - discussed side effects and risks with Depo Provera, including weight gain and irregular vaginal bleeding - all questions answered to patient's satisfaction  - Depo Provera Rx, 150 mg IM q 12 weeks      I discussed the assessment and treatment plan with the patient. The patient was provided an opportunity to ask questions and all were answered. The patient agreed with the plan and demonstrated an understanding of the instructions.   The patient was advised to call back or seek an in-person evaluation/go to the ED if the symptoms worsen or if the condition fails to improve as anticipated.  I provided 15 minutes of face-to-face time during this encounter.   Coral Ceo, MD Center for [facility], [facility] 07/31/19

## 2019-07-31 NOTE — Progress Notes (Signed)
Pt is on the phone preparing for virtual visit with provider. She would like to talk to the provider about contraception, pt is currently SA.

## 2020-01-07 IMAGING — US US MFM OB COMP + 14 WK
1 series · 13 of 28 positions shown · non-contrast
Comparison: none

[Series 1: us mfm ob comp + 14 wk · 13 of 103 slices shown]
[im 4/103]
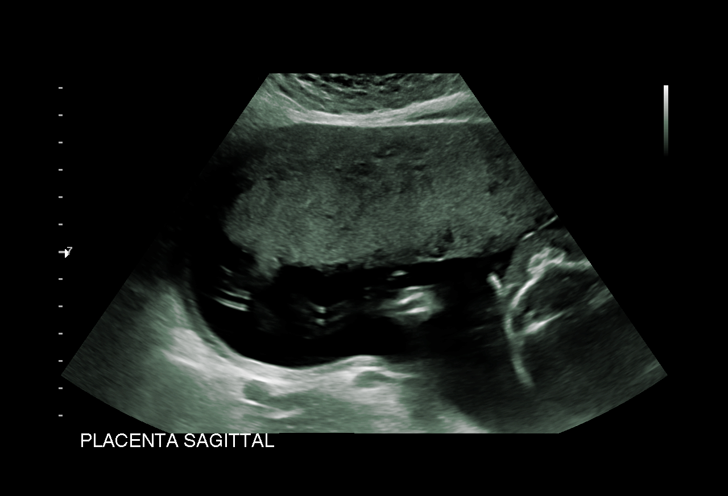
[im 12/103]
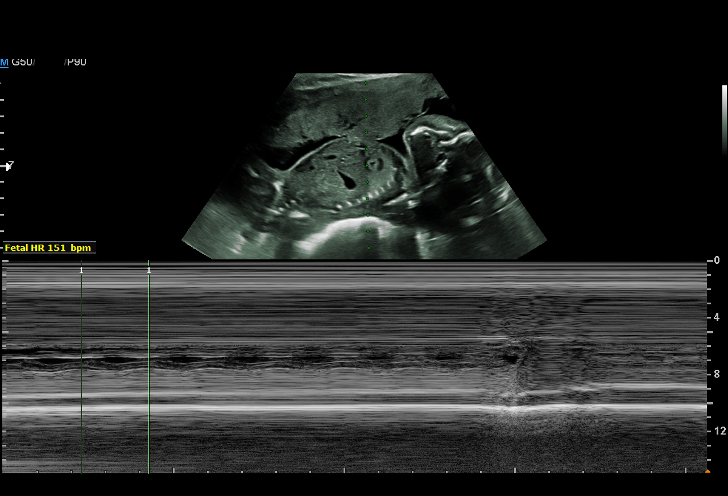
[im 19/103]
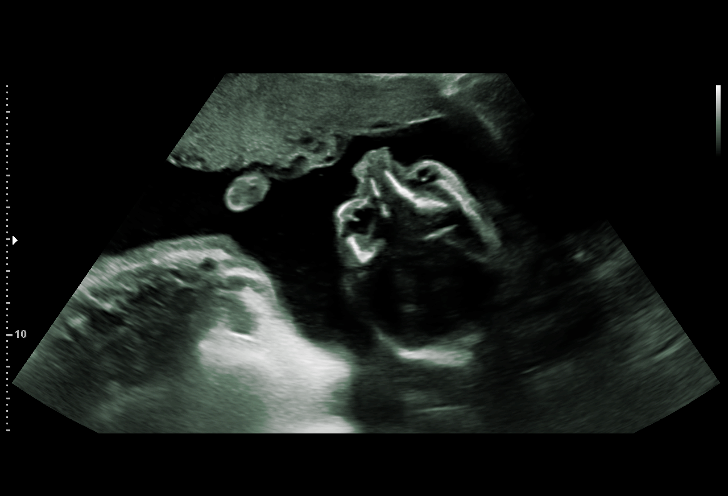
[im 27/103]
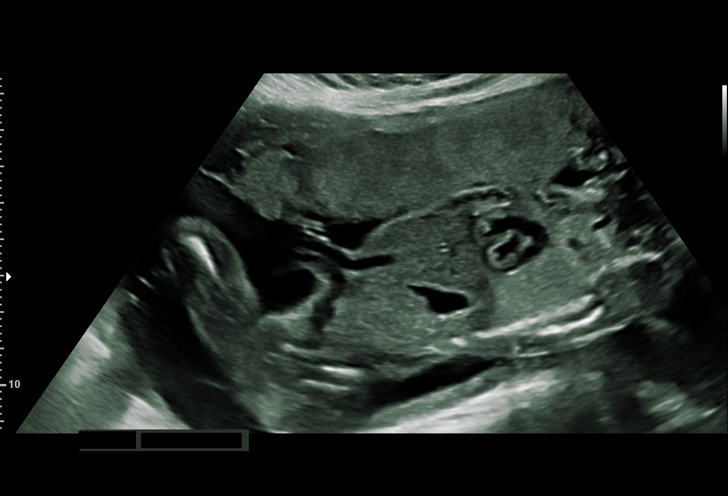
[im 35/103]
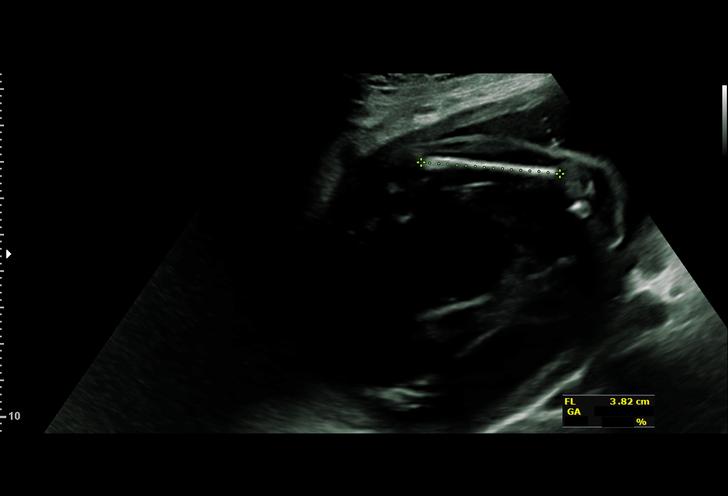
[im 42/103]
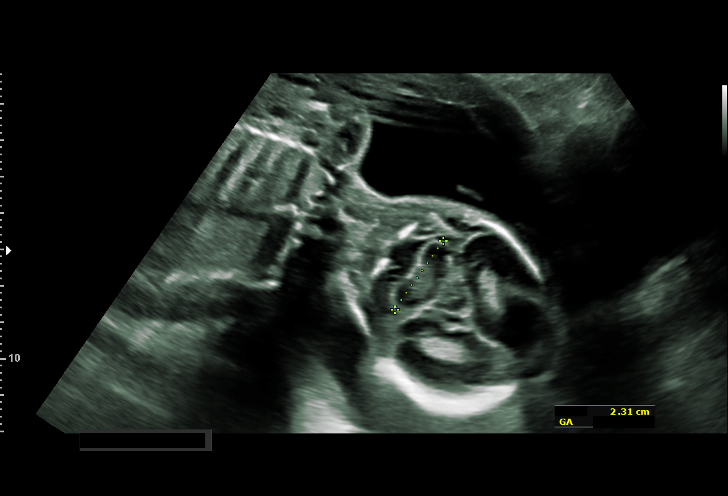
[im 53/103]
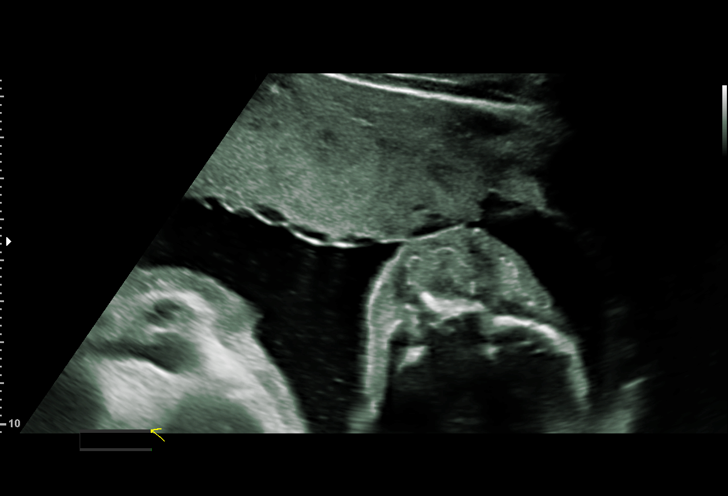
[im 61/103]
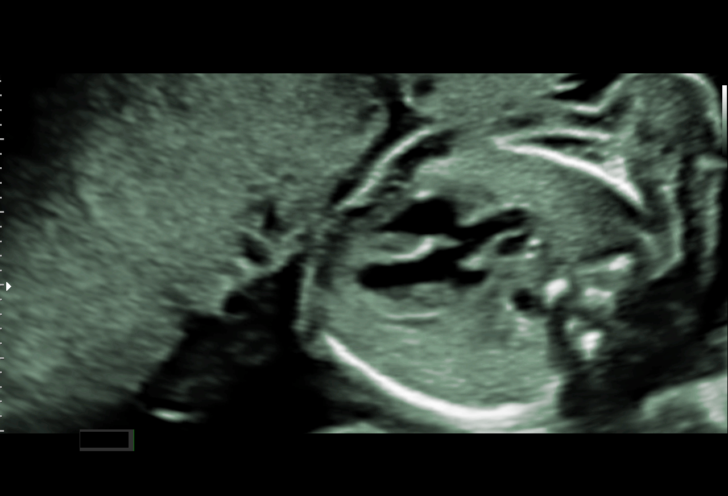
[im 69/103]
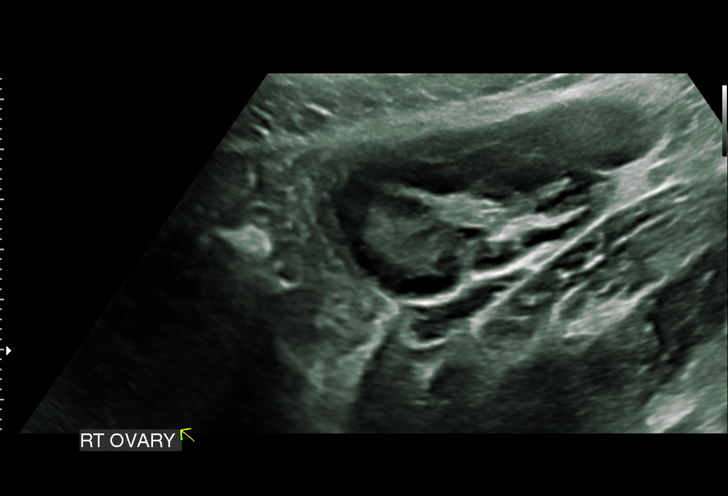
[im 76/103]
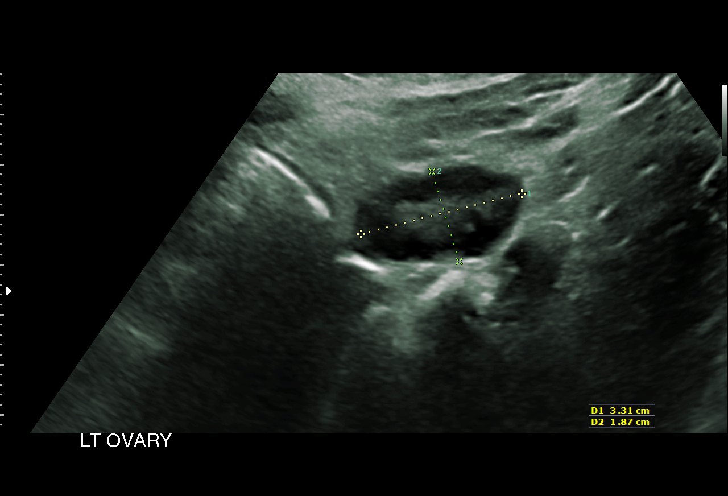
[im 84/103]
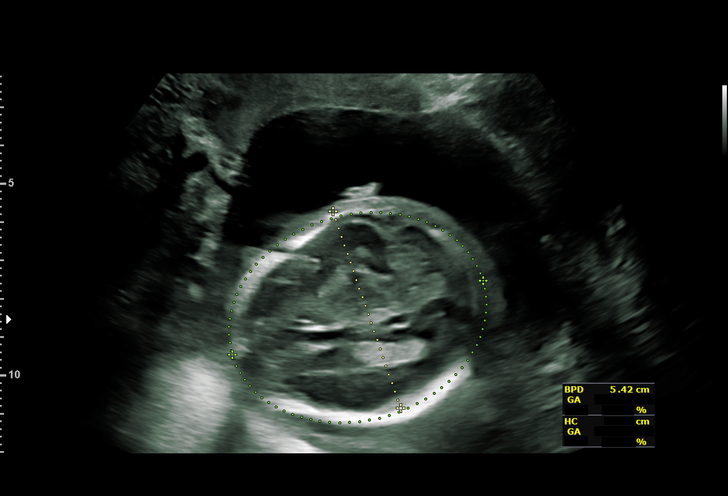
[im 91/103]
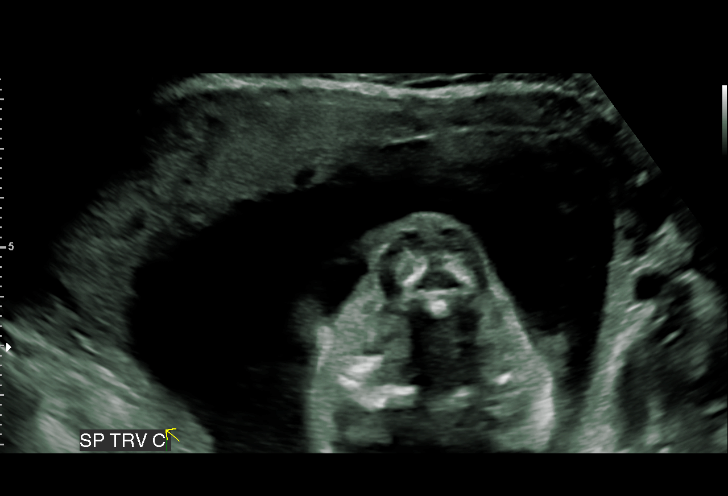
[im 99/103]
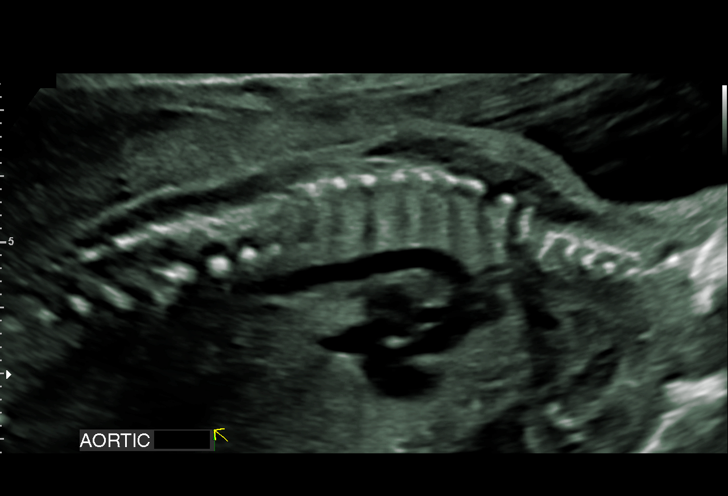

[13 of 28 positions shown; findings below may reference images not displayed]

1  US MFM OB COMP + 14 WK               76805.01     RUDI JUMPER
 ----------------------------------------------------------------------

 ----------------------------------------------------------------------
Indications

  Encounter for antenatal screening for
  malformations
  Late prenatal care, second trimester
  21 weeks gestation of pregnancy
  Normal AFP
 ----------------------------------------------------------------------
Fetal Evaluation

 Num Of Fetuses:          1
 Fetal Heart Rate(bpm):   151
 Cardiac Activity:        Observed
 Presentation:            Cephalic
 Placenta:                Anterior
 P. Cord Insertion:       Visualized, central

 Amniotic Fluid
 AFI FV:      Within normal limits

                             Largest Pocket(cm)

Biometry

 BPD:      53.3  mm     G. Age:  22w 1d         89  %    CI:        74.97   %    70 - 86
                                                         FL/HC:       19.4  %    15.9 -
 HC:      195.3  mm     G. Age:  21w 5d         73  %    HC/AC:       1.19       1.06 -
 AC:      163.5  mm     G. Age:  21w 3d         57  %    FL/BPD:      71.1  %
 FL:       37.9  mm     G. Age:  22w 1d         78  %    FL/AC:       23.2  %    20 - 24
 HUM:      35.9  mm     G. Age:  22w 4d         83  %
 CER:        23  mm     G. Age:  21w 4d         65  %
 LV:          6  mm
 Est. FW:     448   gm          1 lb     83  %
OB History

 Gravidity:    1
Gestational Age

 Clinical EDD:  21w 0d                                        EDD:   01/31/19
 U/S Today:     21w 6d                                        EDD:   01/25/19
 Best:          21w 0d     Det. By:  Clinical EDD             EDD:   01/31/19
Anatomy

 Cranium:               Appears normal         LVOT:                   Appears normal
 Cavum:                 Appears normal         Aortic Arch:            Appears normal
 Ventricles:            Appears normal         Ductal Arch:            Appears normal
 Choroid Plexus:        Appears normal         Diaphragm:              Appears normal
 Cerebellum:            Appears normal         Stomach:                Appears normal, left
                                                                       sided
 Posterior Fossa:       Appears normal         Abdomen:                Appears normal
 Nuchal Fold:           Not applicable (>20    Abdominal Wall:         Appears nml (cord
                        wks GA)                                        insert, abd wall)
 Face:                  Appears normal         Cord Vessels:           Appears normal (3
                        (orbits and profile)                           vessel cord)
 Lips:                  Appears normal         Kidneys:                Appear normal
 Palate:                Appears normal         Bladder:                Appears normal
 Thoracic:              Appears normal         Spine:                  Appears normal
 Heart:                 Appears normal         Upper Extremities:      Appears normal
                        (4CH, axis, and
                        situs)
 RVOT:                  Appears normal         Lower Extremities:      Appears normal

 Other:  Heels visualized. Nasal bone visualized. Male gender.
Cervix Uterus Adnexa

 Cervix
 Length:           3.77  cm.
 Normal appearance by transabdominal scan.

 Left Ovary
 Within normal limits.

 Right Ovary
 Within normal limits.
Impression

 Normal interval growth.  No ultrasonic evidence of structural
 fetal anomalies.
Recommendations

 Follow up as clinically indicated.

## 2022-01-30 NOTE — L&D Delivery Note (Signed)
OB/GYN Faculty Practice Delivery Note  Brianna Mercado is a 22 y.o. [id number] s/p VD at [redacted]w[redacted]d. She was admitted for PPROM.   ROM: 27h 40m with clear fluid GBS Status: unknown- PCN given   Maximum Maternal Temperature: 98.7 F  Labor Progress: Initial SVE: 1.5/20/-3. She then progressed to complete.   Delivery Date/Time: 08/09/22  0853 Delivery: Called to room and patient was complete and pushing. Head delivered ROA. Nuchal cord present x1, compound R hand. Shoulder and body delivered in usual fashion. Infant with spontaneous cry, placed on mother's abdomen, dried and stimulated. Cord clamped x 2 after 1-minute delay, and cut by FOB. Cord blood drawn. Placenta delivered spontaneously with gentle cord traction. Fundus firm with massage and Pitocin. Labia, perineum, vagina, and cervix inspected with bilateral periurethral lacerations, hemostatic.  Baby Weight: pending  Placenta: 3 vessel, intact. Sent to path Complications: None Lacerations: As above EBL: 113 mL Analgesia: Epidural  Infant:  APGAR (1 MIN): 9   APGAR (5 MINS): 9    Myrtie Hawk, DO OB Family Medicine Fellow, [facility] for [employer], [facility] 08/09/2022, 9:18 AM

## 2022-02-08 ENCOUNTER — Ambulatory Visit: Payer: Medicaid Other | Admitting: *Deleted

## 2022-02-08 ENCOUNTER — Ambulatory Visit (INDEPENDENT_AMBULATORY_CARE_PROVIDER_SITE_OTHER): Payer: Medicaid Other

## 2022-02-08 VITALS — BP 112/69 | HR 65 | Ht 63.0 in | Wt 144.7 lb

## 2022-02-08 DIAGNOSIS — Z3480 Encounter for supervision of other normal pregnancy, unspecified trimester: Secondary | ICD-10-CM

## 2022-02-08 DIAGNOSIS — Z3A09 9 weeks gestation of pregnancy: Secondary | ICD-10-CM | POA: Diagnosis not present

## 2022-02-08 DIAGNOSIS — Z3481 Encounter for supervision of other normal pregnancy, first trimester: Secondary | ICD-10-CM | POA: Diagnosis not present

## 2022-02-08 MED ORDER — PRENATAL PLUS VITAMIN/MINERAL 27-1 MG PO TABS: 1.0000 | ORAL_TABLET | Freq: Every day | ORAL | 12 refills | Status: DC

## 2022-02-08 MED ORDER — BLOOD PRESSURE KIT DEVI: 1.0000 | 0 refills | Status: AC

## 2022-02-08 NOTE — Progress Notes (Signed)
New OB Intake  I connected withNAME@  on 02/08/22 at  8:15 AM EST by In Person Visit and verified that I am speaking with the correct person using two identifiers. Nurse is located at [facility] and pt is located at [place].  I discussed the limitations, risks, security and privacy concerns of performing an evaluation and management service by telephone and the availability of in person appointments. I also discussed with the patient that there may be a patient responsible charge related to this service. The patient expressed understanding and agreed to proceed.  I explained I am completing New OB Intake today. We discussed EDD of 09/06/22 that is based on [place] on 02/08/22 at [redacted]w[redacted]d ([place] measurements used). Pt is G3/P1. I reviewed her allergies, medications, Medical/Surgical/OB history, and appropriate screenings. I informed her of [facility] services. [facility] information placed in AVS. Based on history, this is a low risk pregnancy.  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 Patient given information for [facility] Healthy Baby website for more information about Women's and [place]. Patient is interested in water birth. Offered upcoming OB visit with CNM to discuss further.  MyChart/Babyscripts MyChart access verified. I explained pt will have some visits in office and some virtually. Babyscripts instructions given and order placed. Patient verifies receipt of registration text/e-mail. Account successfully created and app downloaded.  Blood Pressure Cuff/Weight Scale Blood pressure cuff ordered for patient to pick-up from [employer]. Explained after first prenatal appt pt will check weekly and document in 51. Patient does not have weight scale; patient may purchase if they desire to track weight weekly in Babyscripts.  Anatomy US Explained first scheduled [place] will be around  19 weeks. Anatomy US scheduled for 04/12/22 at [facility]. Pt notified to arrive at 9:45.  Labs Discussed Johnsie Cancel genetic screening with patient. Would like both Panorama and Horizon drawn at new OB visit. Routine prenatal labs needed.  COVID Vaccine Patient has had COVID vaccine.  Social Determinants of Health Food Insecurity: Patient denies food insecurity. WIC Referral: Patient is interested in referral to [facility].  Transportation: Patient denies transportation needs. Childcare: Discussed no children allowed at ultrasound appointments. Offered childcare services; patient declines childcare services at this time.  First visit review I reviewed new OB appt with patient. I explained they will have a provider visit that includes Pap, GC/CC, OB Urine. Explained pt will be seen by Dr. Jodi Mourning at first visit; encounter routed to appropriate provider. Explained that patient will be seen by pregnancy navigator following visit with provider.   Penny Pia, RN 02/08/2022  8:15 AM

## 2022-02-09 ENCOUNTER — Other Ambulatory Visit: Payer: Self-pay | Admitting: Obstetrics

## 2022-02-09 DIAGNOSIS — O99011 Anemia complicating pregnancy, first trimester: Secondary | ICD-10-CM

## 2022-02-09 LAB — CBC/D/PLT+RPR+RH+ABO+RUBIGG...
Antibody Screen: NEGATIVE
Basophils Absolute: 0 10*3/uL (ref 0.0–0.2)
Basos: 1 %
EOS (ABSOLUTE): 0.1 10*3/uL (ref 0.0–0.4)
Eos: 3 %
HCV Ab: NONREACTIVE
HIV Screen 4th Generation wRfx: NONREACTIVE
Hematocrit: 34.1 % (ref 34.0–46.6)
Hemoglobin: 11.5 g/dL (ref 11.1–15.9)
Hepatitis B Surface Ag: NEGATIVE
Immature Grans (Abs): 0 10*3/uL (ref 0.0–0.1)
Immature Granulocytes: 0 %
Lymphocytes Absolute: 1.6 10*3/uL (ref 0.7–3.1)
Lymphs: 28 %
MCH: 28.6 pg (ref 26.6–33.0)
MCHC: 33.7 g/dL (ref 31.5–35.7)
MCV: 85 fL (ref 79–97)
Monocytes Absolute: 0.5 10*3/uL (ref 0.1–0.9)
Monocytes: 10 %
Neutrophils Absolute: 3.2 10*3/uL (ref 1.4–7.0)
Neutrophils: 58 %
Platelets: 308 10*3/uL (ref 150–450)
RBC: 4.02 x10E6/uL (ref 3.77–5.28)
RDW: 13.6 % (ref 11.7–15.4)
RPR Ser Ql: NONREACTIVE
Rh Factor: POSITIVE
Rubella Antibodies, IGG: 1.23 index (ref >0.99)
WBC: 5.5 10*3/uL (ref 3.4–10.8)

## 2022-02-09 LAB — HCV INTERPRETATION

## 2022-02-09 MED ORDER — ACCRUFER 30 MG PO CAPS: 1.0000 | ORAL_CAPSULE | Freq: Two times a day (BID) | ORAL | 3 refills | Status: DC

## 2022-02-09 MED ORDER — PRENATAL PLUS VITAMIN/MINERAL 27-1 MG PO TABS: 1.0000 | ORAL_TABLET | Freq: Every day | ORAL | 4 refills | Status: AC

## 2022-02-09 MED ORDER — PRENATAL PLUS VITAMIN/MINERAL 27-1 MG PO TABS: 1.0000 | ORAL_TABLET | Freq: Every day | ORAL | 4 refills | Status: DC

## 2022-02-10 ENCOUNTER — Telehealth: Payer: Self-pay

## 2022-02-10 NOTE — Telephone Encounter (Signed)
S/w pt and advised of results and rx sent to [facility]

## 2022-02-13 LAB — PANORAMA PRENATAL TEST FULL PANEL:PANORAMA TEST PLUS 5 ADDITIONAL MICRODELETIONS: FETAL FRACTION: 9.2

## 2022-03-07 ENCOUNTER — Encounter: Payer: Medicaid Other | Admitting: Obstetrics & Gynecology

## 2022-03-07 ENCOUNTER — Ambulatory Visit (INDEPENDENT_AMBULATORY_CARE_PROVIDER_SITE_OTHER): Payer: Medicaid Other | Admitting: Licensed Clinical Social Worker

## 2022-03-07 ENCOUNTER — Encounter: Payer: Self-pay | Admitting: Obstetrics

## 2022-03-07 ENCOUNTER — Ambulatory Visit (INDEPENDENT_AMBULATORY_CARE_PROVIDER_SITE_OTHER): Payer: Medicaid Other | Admitting: Obstetrics

## 2022-03-07 ENCOUNTER — Other Ambulatory Visit (HOSPITAL_COMMUNITY)
Admission: RE | Admit: 2022-03-07 | Discharge: 2022-03-07 | Disposition: A | Discharge status: Home / Self Care | Payer: Medicaid Other | Source: Ambulatory Visit | Attending: Obstetrics & Gynecology | Admitting: Obstetrics & Gynecology

## 2022-03-07 VITALS — BP 112/71 | HR 71 | Wt 147.0 lb

## 2022-03-07 DIAGNOSIS — F4321 Adjustment disorder with depressed mood: Secondary | ICD-10-CM | POA: Diagnosis not present

## 2022-03-07 DIAGNOSIS — Z3480 Encounter for supervision of other normal pregnancy, unspecified trimester: Secondary | ICD-10-CM | POA: Insufficient documentation

## 2022-03-07 DIAGNOSIS — Z3A13 13 weeks gestation of pregnancy: Secondary | ICD-10-CM

## 2022-03-07 DIAGNOSIS — Z3482 Encounter for supervision of other normal pregnancy, second trimester: Secondary | ICD-10-CM | POA: Diagnosis not present

## 2022-03-07 NOTE — Progress Notes (Signed)
Pt presents for NOB visit reports no complaints.

## 2022-03-07 NOTE — Progress Notes (Signed)
Subjective:    Brianna Mercado is being seen today for her first obstetrical visit.  This is not a planned pregnancy. She is at [redacted]w[redacted]d gestation. Her obstetrical history is significant for  none . Relationship with FOB: significant other, not living together. Patient does intend to breast feed. Pregnancy history fully reviewed.  The information documented in the HPI was reviewed and verified.  Menstrual History: OB History     Gravida  3   Para  1   Term  1   Preterm  0   AB  1   Living  1      SAB  0   IAB  1   Ectopic  0   Multiple  0   Live Births  1            No LMP recorded. Patient is pregnant.    Past Medical History:  Diagnosis Date   No pertinent past medical history     Past Surgical History:  Procedure Laterality Date   NO PAST SURGERIES      (Not in a hospital admission)  No Known Allergies  Social History   Tobacco Use   Smoking status: Never   Smokeless tobacco: Never  Substance Use Topics   Alcohol use: No    Family History  Problem Relation Age of Onset   Healthy Father    Sleep apnea Mother      Review of Systems Constitutional: negative for weight loss Gastrointestinal: negative for vomiting Genitourinary:negative for genital lesions and vaginal discharge and dysuria Musculoskeletal:negative for back pain Behavioral/Psych: negative for abusive relationship, depression, illegal drug usage and tobacco use    Objective:    BP 112/71   Pulse 71   Wt 147 lb (66.7 kg)   BMI 26.04 kg/m  General Appearance:    Alert, cooperative, no distress, appears stated age  Head:    Normocephalic, without obvious abnormality, atraumatic  Eyes:    PERRL, conjunctiva/corneas clear, EOM's intact, fundi    benign, both eyes  Ears:    Normal TM's and external ear canals, both ears  Nose:   Nares normal, septum midline, mucosa normal, no drainage    or sinus tenderness  Throat:   Lips, mucosa, and tongue normal; teeth and gums normal   Neck:   Supple, symmetrical, trachea midline, no adenopathy;    thyroid:  no enlargement/tenderness/nodules; no carotid   bruit or JVD  Back:     Symmetric, no curvature, ROM normal, no CVA tenderness  Lungs:     Clear to auscultation bilaterally, respirations unlabored  Chest Wall:    No tenderness or deformity   Heart:    Regular rate and rhythm, S1 and S2 normal, no murmur, rub   or gallop  Breast Exam:    No tenderness, masses, or nipple abnormality  Abdomen:     Soft, non-tender, bowel sounds active all four quadrants,    no masses, no organomegaly  Genitalia:    Normal female without lesion, discharge or tenderness  Extremities:   Extremities normal, atraumatic, no cyanosis or edema  Pulses:   2+ and symmetric all extremities  Skin:   Skin color, texture, turgor normal, no rashes or lesions  Lymph nodes:   Cervical, supraclavicular, and axillary nodes normal  Neurologic:   CNII-XII intact, normal strength, sensation and reflexes    throughout      Lab Review Urine pregnancy test Labs reviewed yes Radiologic studies reviewed no  Assessment:  Pregnancy at [redacted]w[redacted]d weeks    Plan:    1. Supervision of other normal pregnancy, antepartum [Z34.80] Rx: - Culture, OB Urine - Cervicovaginal ancillary only( [place]) - Cytology - PAP( [place]) - [place] MFM OB COMP + 14 WK; Future   Prenatal vitamins.  Counseling provided regarding continued use of seat belts, cessation of alcohol consumption, smoking or use of illicit drugs; infection precautions i.e., influenza/TDAP immunizations, toxoplasmosis,CMV, parvovirus, listeria and varicella; workplace safety, exercise during pregnancy; routine dental care, safe medications, sexual activity, hot tubs, saunas, pools, travel, caffeine use, fish and methlymercury, potential toxins, hair treatments, varicose veins Weight gain recommendations per IOM guidelines reviewed: underweight/BMI< 18.5--> gain 28 - 40 lbs; normal weight/BMI 18.5 -  24.9--> gain 25 - 35 lbs; overweight/BMI 25 - 29.9--> gain 15 - 25 lbs; obese/BMI >30->gain  11 - 20 lbs Problem list reviewed and updated. FIRST/CF mutation testing/NIPT/QUAD SCREEN/fragile X/Ashkenazi Jewish population testing/Spinal muscular atrophy discussed: requested. Role of ultrasound in pregnancy discussed; fetal survey: requested. Amniocentesis discussed: not indicated.   Orders Placed This Encounter  Procedures   Culture, OB Urine   [place] MFM OB COMP + 14 WK    Standing Status:   Future    Standing Expiration Date:   03/08/2023    Order Specific Question:   Reason for Exam (SYMPTOM  OR DIAGNOSIS REQUIRED)    Answer:   anatomy    Order Specific Question:   Preferred Location    Answer:   [facility]    Follow up in 4 weeks.  Shelly Bombard, MD 03/07/2022 10:12 AM

## 2022-03-08 LAB — CERVICOVAGINAL ANCILLARY ONLY
Bacterial Vaginitis (gardnerella): POSITIVE — AB
Candida Glabrata: NEGATIVE
Candida Vaginitis: NEGATIVE
Chlamydia: NEGATIVE
Comment: NEGATIVE
Comment: NEGATIVE
Comment: NEGATIVE
Comment: NEGATIVE
Comment: NEGATIVE
Comment: NORMAL
Neisseria Gonorrhea: NEGATIVE
Trichomonas: NEGATIVE

## 2022-03-08 NOTE — BH Specialist Note (Signed)
Integrated Behavioral Health Initial In-Person Visit  MRN: [id number] Name: Brianna Mercado  Number of [place] Clinician visits: 1 Session Start time:   10:33am Session End time: 11:16am Total time in minutes: 42 mins in [facility] at [facility]   Types of Service: Individual psychotherapy  Interpretor:No. Interpretor Name and Language: none   Warm Hand Off Completed.        Subjective: Brianna Mercado is a 22 y.o. female accompanied by n/a Patient was referred by Dr. Jodi Mourning for depression. Patient reports the following symptoms/concerns: depressed mood  Duration of problem: approx one month; Severity of problem: mild  Objective: Mood: good and Affect: Appropriate Risk of harm to self or others: No plan to harm self or others  Life Context: Family and Social: Lives in [place]  School/Work: Ship broker at [employer]  Self-Care: n/a Life Changes: New pregnancy  Patient and/or Family's Strengths/Protective Factors: Concrete supports in place (healthy food, safe environments, etc.)  Goals Addressed: Patient will: Reduce symptoms of: depression Increase knowledge and/or ability of: coping skills  Demonstrate ability to: Increase healthy adjustment to current life circumstances  Progress towards Goals: Ongoing  Interventions: Interventions utilized: Motivational Interviewing and Supportive Counseling  Standardized Assessments completed: PHQ 9  Patient and/or Family Response: Ms. Verno reports stress and depressed mood looking for employment and taking time off from school.    Assessment: Patient currently experiencing adjustment disorder with depressed mood.   Patient may benefit from integrated behavioral health.  Plan: Follow up with behavioral health clinician on : 3-4 weeks  Behavioral recommendations: Create smart goals, counteract negative thought patterns, communicate needs with partner and family for added support  Referral(s): [place] (In Clinic) "From scale of 1-10, how likely are you to follow plan?":    Lynnea Ferrier, LCSW

## 2022-03-09 LAB — CYTOLOGY - PAP: Diagnosis: NEGATIVE

## 2022-03-09 LAB — CULTURE, OB URINE

## 2022-03-09 LAB — URINE CULTURE, OB REFLEX

## 2022-04-04 ENCOUNTER — Ambulatory Visit (INDEPENDENT_AMBULATORY_CARE_PROVIDER_SITE_OTHER): Payer: Medicaid Other | Admitting: Medical

## 2022-04-04 ENCOUNTER — Encounter: Payer: Self-pay | Admitting: Medical

## 2022-04-04 VITALS — BP 123/73 | HR 83 | Wt 151.2 lb

## 2022-04-04 DIAGNOSIS — Z3A17 17 weeks gestation of pregnancy: Secondary | ICD-10-CM

## 2022-04-04 DIAGNOSIS — O99011 Anemia complicating pregnancy, first trimester: Secondary | ICD-10-CM

## 2022-04-04 DIAGNOSIS — O99012 Anemia complicating pregnancy, second trimester: Secondary | ICD-10-CM

## 2022-04-04 DIAGNOSIS — Z148 Genetic carrier of other disease: Secondary | ICD-10-CM

## 2022-04-04 DIAGNOSIS — Z3480 Encounter for supervision of other normal pregnancy, unspecified trimester: Secondary | ICD-10-CM

## 2022-04-04 NOTE — Progress Notes (Signed)
Pt presents for ROB visit. No concerns at this time.

## 2022-04-04 NOTE — Progress Notes (Signed)
   PRENATAL VISIT NOTE  Subjective:  Brianna Mercado is a 22 y.o. G3P1011 at 54w6dbeing seen today for ongoing prenatal care.  She is currently monitored for the following issues for this low-risk pregnancy and has Genetic carrier and Supervision of other normal pregnancy, antepartum on their problem list.  Patient reports no complaints.  Contractions: Not present. Vag. Bleeding: None.  Movement: Present. Denies leaking of fluid.   The following portions of the patient's history were reviewed and updated as appropriate: allergies, current medications, past family history, past medical history, past social history, past surgical history and problem list.   Objective:   Vitals:   04/04/22 0941  BP: 123/73  Pulse: 83  Weight: 151 lb 3.2 oz (68.6 kg)    Fetal Status: Fetal Heart Rate (bpm): 145   Movement: Present     General:  Alert, oriented and cooperative. Patient is in no acute distress.  Skin: Skin is warm and dry. No rash noted.   Cardiovascular: Normal heart rate noted  Respiratory: Normal respiratory effort, no problems with respiration noted  Abdomen: Soft, gravid, appropriate for gestational age.  Pain/Pressure: Absent     Pelvic: Cervical exam deferred        Extremities: Normal range of motion.  Edema: None  Mental Status: Normal mood and affect. Normal behavior. Normal judgment and thought content.   Assessment and Plan:  Pregnancy: G3P1011 at 129w6d. Supervision of other normal pregnancy, antepartum - AFP, Serum, Open Spina Bifida [facility] 2. 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 Anemia affecting pregnancy in first trimester - On PO iron, plan to recheck at 28 weeks   4. [redacted] weeks gestation of pregnancy  Preterm labor symptoms and general obstetric precautions including but not limited to vaginal bleeding, contractions, leaking of fluid and fetal movement were reviewed in detail with the patient. Please  refer to After Visit Summary for other counseling recommendations.   Return in about 4 weeks (around 05/02/2022) for LOB, In-Person, any provider.  Future Appointments  Date Time Provider [place]3/13/2024  9:45 AM [facility] US5 [facility]  JuKerry HoughPA-C

## 2022-04-06 ENCOUNTER — Ambulatory Visit (INDEPENDENT_AMBULATORY_CARE_PROVIDER_SITE_OTHER): Payer: Medicaid Other | Admitting: Licensed Clinical Social Worker

## 2022-04-06 DIAGNOSIS — F4321 Adjustment disorder with depressed mood: Secondary | ICD-10-CM | POA: Diagnosis not present

## 2022-04-06 LAB — AFP, SERUM, OPEN SPINA BIFIDA
AFP MoM: 1.06
AFP Value: 44.8 ng/mL
Gest. Age on Collection Date: 17 weeks
Maternal Age At EDD: 23 yr
OSBR Risk 1 IN: 10000
Test Results:: NEGATIVE
Weight: 151 [lb_av]

## 2022-04-07 ENCOUNTER — Other Ambulatory Visit: Payer: Self-pay | Admitting: Obstetrics

## 2022-04-07 NOTE — BH Specialist Note (Signed)
Integrated Behavioral Health Follow Up In-Person Visit  MRN: [id number] Name: Brianna Mercado  Number of [place] Clinician visits: 1 Session Start time:  955am Session End time: 1030am Total time in minutes: 35 mins in person at [facility]   Types of Service: Individual psychotherapy  Interpretor:No. Interpretor Name and Language: None  Subjective: Brianna Mercado is a 22 y.o. female accompanied by n/a Patient was referred by Dr Jodi Mourning for depression. Patient reports the following symptoms/concerns: depressed, crying and overwhelmed  Duration of problem: approx one month; Severity of problem: mild  Objective: Mood: Depressed and Affect: Appropriate Risk of harm to self or others: No plan to harm self or others  Life Context: Family and Social: Moving in with family within the week School/Work: In [employer]  Self-Care: None  Life Changes: Relationship conflict and new pregnancy  Patient and/or Family's Strengths/Protective Factors: Concrete supports in place (healthy food, safe environments, etc.)  Goals Addressed: Patient will:  Reduce symptoms of: depression   Increase knowledge and/or ability of: coping skills   Demonstrate ability to: Increase healthy adjustment to current life circumstances  Progress towards Goals: Ongoing  Interventions: Interventions utilized:  Motivational Interviewing and Supportive Counseling Standardized Assessments completed: Not Needed  Patient and/or Family Response: Brianna Mercado reports depressed mood and sadness most days since finding out boyfriend's infidelity. Brianna Mercado reports difficulty with concentration and low motivation.   Assessment: Patient currently experiencing adjustment disorder with depressed mood.   Patient may benefit from integrated behavioral health.  Plan: Follow up with behavioral health clinician on : as needed  Behavioral recommendations: Journal writing, prioritize self care  Referral(s):  [place] (In Clinic) "From scale of 1-10, how likely are you to follow plan?":    Lynnea Ferrier, LCSW

## 2022-04-12 ENCOUNTER — Other Ambulatory Visit: Payer: Self-pay | Admitting: *Deleted

## 2022-04-12 ENCOUNTER — Ambulatory Visit: Payer: Medicaid Other | Admitting: *Deleted

## 2022-04-12 ENCOUNTER — Ambulatory Visit: Payer: Medicaid Other | Attending: Obstetrics

## 2022-04-12 ENCOUNTER — Encounter: Payer: Self-pay | Admitting: *Deleted

## 2022-04-12 VITALS — BP 118/66 | HR 69

## 2022-04-12 DIAGNOSIS — Z3480 Encounter for supervision of other normal pregnancy, unspecified trimester: Secondary | ICD-10-CM | POA: Diagnosis present

## 2022-04-12 DIAGNOSIS — Z3A18 18 weeks gestation of pregnancy: Secondary | ICD-10-CM | POA: Diagnosis not present

## 2022-04-12 DIAGNOSIS — Z3689 Encounter for other specified antenatal screening: Secondary | ICD-10-CM | POA: Insufficient documentation

## 2022-04-12 DIAGNOSIS — Z363 Encounter for antenatal screening for malformations: Secondary | ICD-10-CM | POA: Insufficient documentation

## 2022-04-12 DIAGNOSIS — Z362 Encounter for other antenatal screening follow-up: Secondary | ICD-10-CM

## 2022-04-18 ENCOUNTER — Encounter: Payer: Self-pay | Admitting: Obstetrics and Gynecology

## 2022-04-19 ENCOUNTER — Ambulatory Visit: Payer: Medicaid Other | Attending: Maternal & Fetal Medicine

## 2022-04-19 ENCOUNTER — Ambulatory Visit: Payer: Self-pay | Admitting: Maternal & Fetal Medicine

## 2022-04-19 ENCOUNTER — Other Ambulatory Visit: Payer: Self-pay

## 2022-04-19 DIAGNOSIS — Z148 Genetic carrier of other disease: Secondary | ICD-10-CM | POA: Diagnosis not present

## 2022-04-19 NOTE — Progress Notes (Signed)
Virtual Visit via Video Note  I connected with Brianna Mercado on 04/19/22 at 10:00 AM EDT by a video enabled telemedicine application and verified that I am speaking with the correct person using two identifiers.  Location: Patient: home Provider: [facility] [place]   I discussed the limitations of evaluation and management by telemedicine and the availability of in person appointments. The patient expressed understanding and agreed to proceed.  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  She was present on this virtual visit with her partner, Brianna Mercado.   To review, Brianna Mercado elected to undergo carrier screening in her prior pregnancy in 2020 through the [employer] 14 gene panel.  This panel includes genetic testing for 14 recessive and x-linked conditions which include the ACOG recommended panel of cystic fibrosis, spinal muscular atrophy and hemoglobinopathies as well as other conditions.  She was negative for all of the conditions except for the GBA gene which causes Gaucher disease.  See the report for details and residual risk calculations for the other conditions.  Of note, in this pregnancy, she also had [facility] screening for chromosome conditions as well as maternal serum AFP testing for open neural tube defects, both of which were within normal limits.  See those reports for details as well.  Gaucher Disease:  Brianna Mercado's carrier screen was positive for the c.1342G>A variant in the GBA gene.   Gaucher disease is an inherited disorder that is caused by a buildup of glucocerebroside in certain organs, particularly the spleen and liver. The symptoms of Gaucher vary widely among affected individuals and several types of the condition have been identified based  characteristic features. Type I Gaucher disease is the most common and is characterized by hepatosplenomegaly, anemia, easy bruising, thrombocytopenia, lung disease, bone pain, fractures, and arthritis, with sparing of the brain and spinal cord. Symptoms may be mild to severe and may present anytime from childhood to adulthood. Generally, individuals with Type I have a normal lifespan and excellent quality of life with treatment via enzyme replacement therapy or substrate reduction therapy. Type II and Type III Gaucher disease are characterized by the same symptoms as Type I, with the addition of central nervous system features (abnormal eye movements, seizures, and brain damage). Individuals with Type II typically have symptoms which appear in infancy, and affected children typically pass away within the first two years of life. Type II disease is currently untreatable due to the severe, irreversible brain damage. Type III disease has a severity between Type I and Type II. Some individuals with Type III can live into their 22s with treatment. Other rarer forms of Gaucher disease, such as a perinatal lethal form and cardiovascular form have also been described. It is currently not possible to predict which type of the condition a person will have prior to the development of symptoms.   Gaucher disease is caused by pathogenic variants in the GBA gene. This gene provides instructions for making an enzyme called beta-glucocerebrosidase. This enzyme is active in lysosomes, which use digestive enzymes to break down toxic substances in the body. The beta-glucocerebrosidase enzyme is required to metabolize glucocerebroside and break it down into two different components, glucose and ceramide (a simpler fat molecule). Pathogenic variants in the GBA gene cause a deficiency of the beta-glucocerebrosidase enzyme which leads  to a buildup of glucocerebroside in toxic levels within various tissues and organs in the body.   We  discussed that Gaucher disease has autosomal recessive inheritance and that the current pregnancy would only be at risk to be affected if Brianna Mercado is also a carrier for the condition. Per Brianna Mercado, the baseline chance for him to be a carrier is 1 in 158. The chance that this pregnancy would be affected with Gaucher disease without any additional testing is 1 in 632. If Brianna Mercado completes carrier screening for Gaucher and is found to be a carrier, the risk for Gaucher in the current pregnancy would be 1 in 4 (25%). If he is not found to be a carrier, then this risk is greatly reduced. If both members of a couple are known to be carriers, diagnostic testing is available to determine if the pregnancy is affected. This can be performed via CVS at 10-[redacted] weeks gestation or amniocentesis after [redacted] weeks gestation. CVS involves the withdrawal of a small amount of chorionic villi (tissue from the developing placenta). Amniocentesis involves the removal of a small amount of amniotic fluid from the sac surrounding the pregnancy. Ultrasound guidance is used throughout both procedures. Possible procedural difficulties and complications that can arise with these procedures include maternal infection, cramping, bleeding, fluid leakage, and/or pregnancy loss. The risk for pregnancy loss with a CVS/amniocentesis is 1/500. If diagnostic testing via CVS/amniocentesis is not desired, Gaucher testing can be completed after birth.   We additionally reviewed that recent studies suggest that some variants in the GBA gene may be a risk factor for developing Parkinson's disease in late adulthood. Parkinson's disease is a progressive nervous system disorder that affects movement. Tremors are common, but the disorder can also cause stiffness, slowing of movement, rigid muscles, impaired posture and balance, and loss of automatic movements. We reviewed that while there may be increased chance for Brianna Mercado to develop Parkinson's disease in late  adulthood, most carriers never develop this condition.   Family history: We obtained a detailed family history and pregnancy history.  This is the third pregnancy for Brianna Mercado.  She and Brianna Mercado have a healthy 74 year old son together and had one elective termination.  She reported no complications or exposures to medications, alcohol, tobacco or recreational drugs in this pregnancy. In the family history, Brianna Mercado reported a paternal first cousin with sickle cell disease who has a daughter with learning delays. She has had carrier testing for hemoglobinopathies which was negative, so we do not expect this pregnancy to be at risk for sickle cell disease.  Carrier screening for fragile X was also negative, which can be associated with learning differences.  There may also be many other causes for learning differences, including inherited as well as other factors, so if more is learned about this, we are happy to discuss it further.  The remainder of the family history was unremarkable for developmental delays, birth defects, recurrent pregnancy loss or known genetic conditions.  Plan of care: Brianna Mercado will ship a saliva test kit to [facility] for carrier screening.  His contact information is:  Brianna Mercado,  dob 02[birth year]03/2000, email:  [email or web address], phone [phone].   We will follow up with Brianna Mercado once Brianna Mercado's results become available.  If he is a carrier, we will further discuss prenatal diagnostic testing and postnatal testing. Routine prenatal follow-up as recommended.  We appreciate being involved in the care of this family and can be reached at [phone].  Brianna Finlay, MS,  CGC   I provided 40 minutes of non-face-to-face time during this encounter.   Brianna Mercado

## 2022-05-02 ENCOUNTER — Ambulatory Visit: Payer: Medicaid Other | Admitting: Certified Nurse Midwife

## 2022-05-02 DIAGNOSIS — Z3A21 21 weeks gestation of pregnancy: Secondary | ICD-10-CM

## 2022-05-02 DIAGNOSIS — Z148 Genetic carrier of other disease: Secondary | ICD-10-CM

## 2022-05-02 DIAGNOSIS — Z3492 Encounter for supervision of normal pregnancy, unspecified, second trimester: Secondary | ICD-10-CM

## 2022-05-02 NOTE — Progress Notes (Signed)
Patient did not show for appointment today.   Brianna Mercado) Brianna Rotunda, MSN, [place] for [place]  05/02/22 10:30 AM

## 2022-05-17 ENCOUNTER — Ambulatory Visit: Payer: Medicaid Other | Attending: Obstetrics and Gynecology

## 2022-05-17 ENCOUNTER — Ambulatory Visit: Payer: Medicaid Other | Admitting: *Deleted

## 2022-05-17 VITALS — BP 121/61 | HR 72

## 2022-05-17 DIAGNOSIS — Z362 Encounter for other antenatal screening follow-up: Secondary | ICD-10-CM | POA: Diagnosis present

## 2022-05-17 DIAGNOSIS — O285 Abnormal chromosomal and genetic finding on antenatal screening of mother: Secondary | ICD-10-CM

## 2022-05-17 DIAGNOSIS — Z3A23 23 weeks gestation of pregnancy: Secondary | ICD-10-CM

## 2022-05-17 DIAGNOSIS — Z363 Encounter for antenatal screening for malformations: Secondary | ICD-10-CM | POA: Insufficient documentation

## 2022-05-17 DIAGNOSIS — Z148 Genetic carrier of other disease: Secondary | ICD-10-CM

## 2022-05-30 ENCOUNTER — Ambulatory Visit (INDEPENDENT_AMBULATORY_CARE_PROVIDER_SITE_OTHER): Payer: Medicaid Other | Admitting: Obstetrics and Gynecology

## 2022-05-30 VITALS — BP 105/69 | HR 75 | Wt 164.4 lb

## 2022-05-30 DIAGNOSIS — Z3A25 25 weeks gestation of pregnancy: Secondary | ICD-10-CM

## 2022-05-30 DIAGNOSIS — Z148 Genetic carrier of other disease: Secondary | ICD-10-CM

## 2022-05-30 DIAGNOSIS — Z3480 Encounter for supervision of other normal pregnancy, unspecified trimester: Secondary | ICD-10-CM

## 2022-05-30 NOTE — Progress Notes (Signed)
   PRENATAL VISIT NOTE  Subjective:  Brianna Mercado is a 22 y.o. G3P1011 at [redacted]w[redacted]d being seen today for ongoing prenatal care.  She is currently monitored for the following issues for this low-risk pregnancy and has Genetic carrier and Supervision of other normal pregnancy, antepartum on their problem list.  Patient doing well with no acute concerns today. She reports no complaints.  Contractions: Not present. Vag. Bleeding: None.  Movement: Present. Denies leaking of fluid.   The following portions of the patient's history were reviewed and updated as appropriate: allergies, current medications, past family history, past medical history, past social history, past surgical history and problem list. Problem list updated.  Objective:   Vitals:   05/30/22 0904  BP: 105/69  Pulse: 75  Weight: 164 lb 6.4 oz (74.6 kg)    Fetal Status: Fetal Heart Rate (bpm): 140 Fundal Height: 25 cm Movement: Present     General:  Alert, oriented and cooperative. Patient is in no acute distress.  Skin: Skin is warm and dry. No rash noted.   Cardiovascular: Normal heart rate noted  Respiratory: Normal respiratory effort, no problems with respiration noted  Abdomen: Soft, gravid, appropriate for gestational age.  Pain/Pressure: Absent     Pelvic: Cervical exam deferred        Extremities: Normal range of motion.  Edema: None  Mental Status:  Normal mood and affect. Normal behavior. Normal judgment and thought content.   Assessment and Plan:  Pregnancy: G3P1011 at [redacted]w[redacted]d  1. [redacted] weeks gestation of pregnancy   2. Supervision of other normal pregnancy, antepartum Continue routine prenatal care  3. Genetic carrier Reviewed Gaucher disease.  Pt states the spouse will likely not get his genetic testing done due to cost  Preterm labor symptoms and general obstetric precautions including but not limited to vaginal bleeding, contractions, leaking of fluid and fetal movement were reviewed in detail with the  patient.  Please refer to After Visit Summary for other counseling recommendations.   Return in about 3 weeks (around 06/20/2022) for ROB, in person, 2 hr GTT, 3rd trim labs.   Mariel Aloe, MD Faculty Attending Center for [facility]

## 2022-05-30 NOTE — Addendum Note (Signed)
Addended by: Dalphine Handing on: 05/30/2022 09:34 AM   Modules accepted: Orders

## 2022-05-30 NOTE — Progress Notes (Signed)
ROB 25.6 wks Reports lower and mid to upper back pain. Advised purchase of pregnancy support belt. Requests refill RX Accrufer (sent to [facility] for 1 =+ 3 refills in Jan)

## 2022-05-31 ENCOUNTER — Encounter: Payer: Self-pay | Admitting: Obstetrics and Gynecology

## 2022-06-20 ENCOUNTER — Encounter: Payer: Self-pay | Admitting: Obstetrics and Gynecology

## 2022-06-20 ENCOUNTER — Ambulatory Visit (INDEPENDENT_AMBULATORY_CARE_PROVIDER_SITE_OTHER): Payer: Medicaid Other | Admitting: Obstetrics and Gynecology

## 2022-06-20 ENCOUNTER — Other Ambulatory Visit: Payer: Medicaid Other

## 2022-06-20 VITALS — BP 117/79 | HR 81 | Wt 173.0 lb

## 2022-06-20 DIAGNOSIS — Z148 Genetic carrier of other disease: Secondary | ICD-10-CM

## 2022-06-20 DIAGNOSIS — Z23 Encounter for immunization: Secondary | ICD-10-CM

## 2022-06-20 DIAGNOSIS — Z3480 Encounter for supervision of other normal pregnancy, unspecified trimester: Secondary | ICD-10-CM

## 2022-06-20 NOTE — Progress Notes (Signed)
Subjective:  Brianna Mercado is a 22 y.o. G3P1011 at [redacted]w[redacted]d being seen today for ongoing prenatal care.  She is currently monitored for the following issues for this low-risk pregnancy and has Genetic carrier and Supervision of other normal pregnancy, antepartum on their problem list.  Patient reports no complaints.  Contractions: Not present. Vag. Bleeding: None.  Movement: Present. Denies leaking of fluid.   The following portions of the patient's history were reviewed and updated as appropriate: allergies, current medications, past family history, past medical history, past social history, past surgical history and problem list. Problem list updated.  Objective:   Vitals:   06/20/22 0936  BP: 117/79  Pulse: 81  Weight: 173 lb (78.5 kg)    Fetal Status: Fetal Heart Rate (bpm): 135   Movement: Present     General:  Alert, oriented and cooperative. Patient is in no acute distress.  Skin: Skin is warm and dry. No rash noted.   Cardiovascular: Normal heart rate noted  Respiratory: Normal respiratory effort, no problems with respiration noted  Abdomen: Soft, gravid, appropriate for gestational age. Pain/Pressure: Absent     Pelvic:  Cervical exam deferred        Extremities: Normal range of motion.     Mental Status: Normal mood and affect. Normal behavior. Normal judgment and thought content.   Urinalysis:      Assessment and Plan:  Pregnancy: G3P1011 at [redacted]w[redacted]d  1. Supervision of other normal pregnancy, antepartum Stable Glucola today  2. Genetic carrier Looking in partner testing  Preterm labor symptoms and general obstetric precautions including but not limited to vaginal bleeding, contractions, leaking of fluid and fetal movement were reviewed in detail with the patient. Please refer to After Visit Summary for other counseling recommendations.  Return in about 1 week (around 06/27/2022) for OB visit, face to face, any provider.   Hermina Staggers, MD

## 2022-06-21 LAB — CBC
Hematocrit: 35.7 % (ref 34.0–46.6)
Hemoglobin: 11.9 g/dL (ref 11.1–15.9)
MCH: 29.1 pg (ref 26.6–33.0)
MCHC: 33.3 g/dL (ref 31.5–35.7)
MCV: 87 fL (ref 79–97)
Platelets: 252 10*3/uL (ref 150–450)
RBC: 4.09 x10E6/uL (ref 3.77–5.28)
RDW: 12.7 % (ref 11.7–15.4)
WBC: 7.8 10*3/uL (ref 3.4–10.8)

## 2022-06-21 LAB — GLUCOSE TOLERANCE, 2 HOURS W/ 1HR
Glucose, 1 hour: 115 mg/dL (ref 70–179)
Glucose, 2 hour: 107 mg/dL (ref 70–152)
Glucose, Fasting: 72 mg/dL (ref 70–91)

## 2022-06-21 LAB — RPR: RPR Ser Ql: NONREACTIVE

## 2022-06-21 LAB — HIV ANTIBODY (ROUTINE TESTING W REFLEX): HIV Screen 4th Generation wRfx: NONREACTIVE

## 2022-06-28 ENCOUNTER — Telehealth (INDEPENDENT_AMBULATORY_CARE_PROVIDER_SITE_OTHER): Payer: Medicaid Other | Admitting: Obstetrics & Gynecology

## 2022-06-28 DIAGNOSIS — Z3A30 30 weeks gestation of pregnancy: Secondary | ICD-10-CM

## 2022-06-28 DIAGNOSIS — Z3483 Encounter for supervision of other normal pregnancy, third trimester: Secondary | ICD-10-CM

## 2022-06-28 DIAGNOSIS — Z3480 Encounter for supervision of other normal pregnancy, unspecified trimester: Secondary | ICD-10-CM

## 2022-06-28 NOTE — Progress Notes (Signed)
I connected with Brianna Mercado 06/28/22 at 10:15 AM EDT by: MyChart video and verified that I am speaking with the correct person using two identifiers.  Patient is located at home and provider is located at [facility].     I discussed the limitations, risks, security and privacy concerns of performing an evaluation and management service by MyChart video and the availability of in person appointments. I also discussed with the patient that there may be a patient responsible charge related to this service. By engaging in this virtual visit, you consent to the provision of healthcare.  Additionally, you authorize for your insurance to be billed for the services provided during this visit.  The patient expressed understanding and agreed to proceed.  The following staff members participated in the virtual visit:  Glass    PRENATAL VISIT NOTE  Subjective:  Brianna Mercado is a 22 y.o. G3P1011 at [redacted]w[redacted]d  for virtual visit for ongoing prenatal care.  She is currently monitored for the following issues for this low-risk pregnancy and has Genetic carrier and Supervision of other normal pregnancy, antepartum on their problem list.  Patient reports no complaints.  Contractions: Not present. Vag. Bleeding: None.  Movement: Present. Denies leaking of fluid.   The following portions of the patient's history were reviewed and updated as appropriate: allergies, current medications, past family history, past medical history, past social history, past surgical history and problem list.   Objective:  There were no vitals filed for this visit. Self-Obtained  Fetal Status:     Movement: Present     Assessment and Plan:  Pregnancy: G3P1011 at [redacted]w[redacted]d 1. Supervision of other normal pregnancy, antepartum Doing well at [redacted]w[redacted]d   Preterm labor symptoms and general obstetric precautions including but not limited to vaginal bleeding, contractions, leaking of fluid and fetal movement were reviewed in detail with the  patient.  Return in about 2 weeks (around 07/12/2022).  No future appointments.   Time spent on virtual visit: 10 minutes  Scheryl Darter, MD

## 2022-06-28 NOTE — Progress Notes (Signed)
TC to patient for ROB. Unable to take BP over the phone. Does not report any concerns today.

## 2022-07-19 ENCOUNTER — Encounter: Payer: Self-pay | Admitting: Obstetrics

## 2022-07-19 ENCOUNTER — Ambulatory Visit (INDEPENDENT_AMBULATORY_CARE_PROVIDER_SITE_OTHER): Payer: Medicaid Other | Admitting: Obstetrics

## 2022-07-19 VITALS — BP 112/72 | HR 97 | Wt 177.6 lb

## 2022-07-19 DIAGNOSIS — Z148 Genetic carrier of other disease: Secondary | ICD-10-CM

## 2022-07-19 DIAGNOSIS — Z3480 Encounter for supervision of other normal pregnancy, unspecified trimester: Secondary | ICD-10-CM

## 2022-07-19 NOTE — Progress Notes (Signed)
Pt presents for ROB. 

## 2022-07-19 NOTE — Progress Notes (Signed)
Subjective:  Brianna Mercado is a 22 y.o. G3P1011 at [redacted]w[redacted]d being seen today for ongoing prenatal care.  She is currently monitored for the following issues for this low-risk pregnancy and has Genetic carrier and Supervision of other normal pregnancy, antepartum on their problem list.  Patient reports no complaints.  Contractions: Not present. Vag. Bleeding: None.  Movement: Present. Denies leaking of fluid.   The following portions of the patient's history were reviewed and updated as appropriate: allergies, current medications, past family history, past medical history, past social history, past surgical history and problem list. Problem list updated.  Objective:   Vitals:   07/19/22 1059  BP: 112/72  Pulse: 97  Weight: 177 lb 9.6 oz (80.6 kg)    Fetal Status: Fetal Heart Rate (bpm): 140   Movement: Present     General:  Alert, oriented and cooperative. Patient is in no acute distress.  Skin: Skin is warm and dry. No rash noted.   Cardiovascular: Normal heart rate noted  Respiratory: Normal respiratory effort, no problems with respiration noted  Abdomen: Soft, gravid, appropriate for gestational age. Pain/Pressure: Present     Pelvic:  Cervical exam deferred        Extremities: Normal range of motion.  Edema: None  Mental Status: Normal mood and affect. Normal behavior. Normal judgment and thought content.   Urinalysis:      Assessment and Plan:  Pregnancy: G3P1011 at [redacted]w[redacted]d  1. Supervision of other normal pregnancy, antepartum  2. Genetic carrier   Preterm labor symptoms and general obstetric precautions including but not limited to vaginal bleeding, contractions, leaking of fluid and fetal movement were reviewed in detail with the patient. Please refer to After Visit Summary for other counseling recommendations.   Return in about 2 weeks (around 08/02/2022) for ROB.   Brock Bad, MD 07/19/22

## 2022-07-31 ENCOUNTER — Ambulatory Visit (INDEPENDENT_AMBULATORY_CARE_PROVIDER_SITE_OTHER): Payer: Medicaid Other | Admitting: Student

## 2022-07-31 VITALS — BP 115/70 | HR 93 | Wt 178.0 lb

## 2022-07-31 DIAGNOSIS — Z3480 Encounter for supervision of other normal pregnancy, unspecified trimester: Secondary | ICD-10-CM

## 2022-07-31 DIAGNOSIS — Z3A34 34 weeks gestation of pregnancy: Secondary | ICD-10-CM

## 2022-07-31 NOTE — Progress Notes (Signed)
   PRENATAL VISIT NOTE  Subjective:  Brianna Mercado is a 22 y.o. G3P1011 at [redacted]w[redacted]d being seen today for ongoing prenatal care.  She is currently monitored for the following issues for this low-risk pregnancy and has Genetic carrier and Supervision of other normal pregnancy, antepartum on their problem list.  Patient reports no complaints.  Contractions: Not present. Vag. Bleeding: None.  Movement: Present. Denies leaking of fluid.   The following portions of the patient's history were reviewed and updated as appropriate: allergies, current medications, past family history, past medical history, past social history, past surgical history and problem list.   Objective:   Vitals:   07/31/22 1503  BP: 115/70  Pulse: 93  Weight: 178 lb (80.7 kg)    Fetal Status: Fetal Heart Rate (bpm): 140   Movement: Present     General:  Alert, oriented and cooperative. Patient is in no acute distress.  Skin: Skin is warm and dry. No rash noted.   Cardiovascular: Normal heart rate noted  Respiratory: Normal respiratory effort, no problems with respiration noted  Abdomen: Soft, gravid, appropriate for gestational age.  Pain/Pressure: Present     Pelvic: Cervical exam deferred        Extremities: Normal range of motion.     Mental Status: Normal mood and affect. Normal behavior. Normal judgment and thought content.   Assessment and Plan:  Pregnancy: G3P1011 at [redacted]w[redacted]d 1. Supervision of other normal pregnancy, antepartum - frequent and vigorous fetal movement  2. [redacted] weeks gestation of pregnancy - continue routine follow-up - Swabs at 36 weeks   Preterm labor symptoms and general obstetric precautions including but not limited to vaginal bleeding, contractions, leaking of fluid and fetal movement were reviewed in detail with the patient. Please refer to After Visit Summary for other counseling recommendations.   Return in about 2 years (around 07/30/2024) for LOB, IN-PERSON with CNM if possible  .  Future Appointments  Date Time Provider Department Center  08/08/2022  9:35 AM Carlynn Herald, CNM [facility] None  08/14/2022 10:55 AM Ndulue, Nadene Rubins, MD [facility] None  08/21/2022  9:35 AM Leftwich-Kirby, Wilmer Floor, CNM [facility] None  08/28/2022  9:35 AM Hermina Staggers, MD [facility] None  09/04/2022  9:35 AM Constant, Gigi Gin, MD [facility] None    Corlis Hove, NP

## 2022-08-08 ENCOUNTER — Other Ambulatory Visit: Payer: Self-pay

## 2022-08-08 ENCOUNTER — Encounter: Payer: Medicaid Other | Admitting: Certified Nurse Midwife

## 2022-08-08 ENCOUNTER — Encounter (HOSPITAL_COMMUNITY): Payer: Self-pay | Admitting: Obstetrics & Gynecology

## 2022-08-08 ENCOUNTER — Inpatient Hospital Stay (HOSPITAL_COMMUNITY)
Admission: AD | Admit: 2022-08-08 | Discharge: 2022-08-11 | DRG: 807 | Disposition: A | Discharge status: Home / Self Care | Payer: Medicaid Other | Attending: Obstetrics and Gynecology | Admitting: Obstetrics and Gynecology

## 2022-08-08 DIAGNOSIS — O26893 Other specified pregnancy related conditions, third trimester: Secondary | ICD-10-CM | POA: Diagnosis present

## 2022-08-08 DIAGNOSIS — Z3A35 35 weeks gestation of pregnancy: Secondary | ICD-10-CM

## 2022-08-08 DIAGNOSIS — O42013 Preterm premature rupture of membranes, onset of labor within 24 hours of rupture, third trimester: Secondary | ICD-10-CM | POA: Diagnosis not present

## 2022-08-08 DIAGNOSIS — Z3480 Encounter for supervision of other normal pregnancy, unspecified trimester: Principal | ICD-10-CM

## 2022-08-08 DIAGNOSIS — Z30430 Encounter for insertion of intrauterine contraceptive device: Secondary | ICD-10-CM

## 2022-08-08 DIAGNOSIS — Z3A36 36 weeks gestation of pregnancy: Secondary | ICD-10-CM | POA: Diagnosis not present

## 2022-08-08 DIAGNOSIS — O326XX0 Maternal care for compound presentation, not applicable or unspecified: Secondary | ICD-10-CM | POA: Diagnosis not present

## 2022-08-08 DIAGNOSIS — O42913 Preterm premature rupture of membranes, unspecified as to length of time between rupture and onset of labor, third trimester: Principal | ICD-10-CM | POA: Diagnosis present

## 2022-08-08 DIAGNOSIS — O4290 Premature rupture of membranes, unspecified as to length of time between rupture and onset of labor, unspecified weeks of gestation: Secondary | ICD-10-CM | POA: Diagnosis present

## 2022-08-08 DIAGNOSIS — Z975 Presence of (intrauterine) contraceptive device: Secondary | ICD-10-CM

## 2022-08-08 DIAGNOSIS — Z3403 Encounter for supervision of normal first pregnancy, third trimester: Secondary | ICD-10-CM

## 2022-08-08 LAB — CBC
HCT: 36.1 % (ref 36.0–46.0)
Hemoglobin: 11.4 g/dL — ABNORMAL LOW (ref 12.0–15.0)
MCH: 26.8 pg (ref 26.0–34.0)
MCHC: 31.6 g/dL (ref 30.0–36.0)
MCV: 84.9 fL (ref 80.0–100.0)
Platelets: 310 10*3/uL (ref 150–400)
RBC: 4.25 MIL/uL (ref 3.87–5.11)
RDW: 13.4 % (ref 11.5–15.5)
WBC: 8.8 10*3/uL (ref 4.0–10.5)
nRBC: 0 % (ref 0.0–0.2)

## 2022-08-08 LAB — TYPE AND SCREEN
ABO/RH(D): AB POS
Antibody Screen: NEGATIVE

## 2022-08-08 LAB — RPR: RPR Ser Ql: NONREACTIVE

## 2022-08-08 LAB — RUPTURE OF MEMBRANE (ROM)PLUS: Rom Plus: POSITIVE

## 2022-08-08 LAB — HIV ANTIBODY (ROUTINE TESTING W REFLEX): HIV Screen 4th Generation wRfx: NONREACTIVE

## 2022-08-08 MED ORDER — ACETAMINOPHEN 325 MG PO TABS: 650.0000 mg | ORAL_TABLET | ORAL | Status: DC | PRN

## 2022-08-08 MED ORDER — EPHEDRINE 5 MG/ML INJ: 10.0000 mg | INTRAVENOUS | Status: DC | PRN

## 2022-08-08 MED ORDER — PHENYLEPHRINE 80 MCG/ML (10ML) SYRINGE FOR IV PUSH (FOR BLOOD PRESSURE SUPPORT)
80.0000 ug | PREFILLED_SYRINGE | INTRAVENOUS | Status: DC | PRN
  Filled 2022-08-08: qty 10

## 2022-08-08 MED ORDER — SOD CITRATE-CITRIC ACID 500-334 MG/5ML PO SOLN: 30.0000 mL | ORAL | Status: DC | PRN

## 2022-08-08 MED ORDER — SODIUM CHLORIDE 0.9 % IV SOLN
5.0000 10*6.[IU] | Freq: Once | INTRAVENOUS | Status: AC
  Administered 2022-08-08: 5 10*6.[IU] via INTRAVENOUS
  Filled 2022-08-08: qty 5

## 2022-08-08 MED ORDER — ONDANSETRON HCL 4 MG/2ML IJ SOLN: 4.0000 mg | Freq: Four times a day (QID) | INTRAMUSCULAR | Status: DC | PRN

## 2022-08-08 MED ORDER — LIDOCAINE HCL (PF) 1 % IJ SOLN: 30.0000 mL | INTRAMUSCULAR | Status: DC | PRN

## 2022-08-08 MED ORDER — FENTANYL-BUPIVACAINE-NACL 0.5-0.125-0.9 MG/250ML-% EP SOLN
12.0000 mL/h | EPIDURAL | Status: DC | PRN
  Filled 2022-08-08: qty 250

## 2022-08-08 MED ORDER — PENICILLIN G POT IN DEXTROSE 60000 UNIT/ML IV SOLN
3.0000 10*6.[IU] | INTRAVENOUS | Status: DC
  Administered 2022-08-08 – 2022-08-09 (×5): 3 10*6.[IU] via INTRAVENOUS
  Filled 2022-08-08 (×5): qty 50

## 2022-08-08 MED ORDER — OXYTOCIN-SODIUM CHLORIDE 30-0.9 UT/500ML-% IV SOLN
1.0000 m[IU]/min | INTRAVENOUS | Status: DC
  Administered 2022-08-08: 2 m[IU]/min via INTRAVENOUS
  Filled 2022-08-08: qty 500

## 2022-08-08 MED ORDER — DIPHENHYDRAMINE HCL 50 MG/ML IJ SOLN: 12.5000 mg | INTRAMUSCULAR | Status: DC | PRN

## 2022-08-08 MED ORDER — TERBUTALINE SULFATE 1 MG/ML IJ SOLN: 0.2500 mg | Freq: Once | INTRAMUSCULAR | Status: DC | PRN

## 2022-08-08 MED ORDER — OXYCODONE-ACETAMINOPHEN 5-325 MG PO TABS: 1.0000 | ORAL_TABLET | ORAL | Status: DC | PRN

## 2022-08-08 MED ORDER — OXYCODONE-ACETAMINOPHEN 5-325 MG PO TABS: 2.0000 | ORAL_TABLET | ORAL | Status: DC | PRN

## 2022-08-08 MED ORDER — LACTATED RINGERS IV SOLN
500.0000 mL | Freq: Once | INTRAVENOUS | Status: AC
  Administered 2022-08-09: 500 mL via INTRAVENOUS

## 2022-08-08 MED ORDER — PHENYLEPHRINE 80 MCG/ML (10ML) SYRINGE FOR IV PUSH (FOR BLOOD PRESSURE SUPPORT)
80.0000 ug | PREFILLED_SYRINGE | INTRAVENOUS | Status: DC | PRN
  Administered 2022-08-09 (×2): 80 ug via INTRAVENOUS

## 2022-08-08 MED ORDER — OXYTOCIN BOLUS FROM INFUSION
333.0000 mL | Freq: Once | INTRAVENOUS | Status: AC
  Administered 2022-08-09: 333 mL via INTRAVENOUS

## 2022-08-08 MED ORDER — BETAMETHASONE SOD PHOS & ACET 6 (3-3) MG/ML IJ SUSP
12.0000 mg | Freq: Once | INTRAMUSCULAR | Status: AC
  Administered 2022-08-08: 12 mg via INTRAMUSCULAR
  Filled 2022-08-08: qty 5

## 2022-08-08 MED ORDER — LACTATED RINGERS IV SOLN
500.0000 mL | INTRAVENOUS | Status: DC | PRN
  Administered 2022-08-09: 1000 mL via INTRAVENOUS

## 2022-08-08 MED ORDER — OXYTOCIN-SODIUM CHLORIDE 30-0.9 UT/500ML-% IV SOLN: 2.5000 [IU]/h | INTRAVENOUS | Status: DC

## 2022-08-08 MED ORDER — LACTATED RINGERS IV SOLN: INTRAVENOUS | Status: DC

## 2022-08-08 NOTE — H&P (Addendum)
OBSTETRIC ADMISSION HISTORY AND PHYSICAL  Brianna Mercado is a 22 y.o. female G3P1011 with IUP at [redacted]w[redacted]d by [place] presenting for vaginal bleeding with ROM. She reports +FMs, No LOF, no VB, no blurry vision, headaches or peripheral edema, and RUQ pain.  She plans on combination feeding. She request depo for birth control. She received her prenatal care at  [facility]    Dating: By [place] --->  Estimated Date of Delivery: 09/06/22  Sono:   @[redacted]w[redacted]d , CWD, normal anatomy, @[redacted]w[redacted]d   normal growth and consistent with dates  Prenatal History/Complications:  - Horizons positive for [employer]   Past Medical History: Past Medical History:  Diagnosis Date   Anemia    No pertinent past medical history     Past Surgical History: Past Surgical History:  Procedure Laterality Date   NO PAST SURGERIES      Obstetrical History: OB History     Gravida  3   Para  1   Term  1   Preterm  0   AB  1   Living  1      SAB  0   IAB  1   Ectopic  0   Multiple  0   Live Births  1           Social History Social History   Socioeconomic History   Marital status: Single    Spouse name: Not on file   Number of children: Not on file   Years of education: Not on file   Highest education level: Not on file  Occupational History   Not on file  Tobacco Use   Smoking status: Never   Smokeless tobacco: Never  Vaping Use   Vaping Use: Never used  Substance and Sexual Activity   Alcohol use: No   Drug use: No   Sexual activity: Yes    Partners: Male  Other Topics Concern   Not on file  Social History Narrative   Not on file   Social Determinants of Health   Financial Resource Strain: Not on file  Food Insecurity: Not on file  Transportation Needs: Not on file  Physical Activity: Not on file  Stress: Not on file  Social Connections: Not on file    Family History: Family History  Problem Relation Age of Onset   Healthy Father    Sleep apnea Mother     Allergies: No Known  Allergies  Medications Prior to Admission  Medication Sig Dispense Refill Last Dose   Ferric Maltol (ACCRUFER) 30 MG CAPS Take 1 capsule by mouth 2 (two) times daily. Take 2 hours before a meal or 2 hours after a meal. 60 capsule 3 08/07/2022   Prenatal Vit-Fe Fumarate-FA (PRENATAL PLUS VITAMIN/MINERAL) 27-1 MG TABS Take 1 tablet by mouth daily before breakfast. 90 tablet 4 08/07/2022   Blood Pressure Monitoring (BLOOD PRESSURE KIT) DEVI 1 Device by Does not apply route once a week. (Patient not taking: Reported on 07/19/2022) 1 each 0      Review of Systems   All systems reviewed and negative except as stated in HPI  Blood pressure 122/71, pulse 73, temperature 98.3 F (36.8 C), temperature source Oral, resp. rate 18, height 5\' 4"  (1.626 m), weight 80.8 kg, SpO2 99 %. General appearance: alert, cooperative, and appears stated age Lungs: clear to auscultation bilaterally Heart: regular rate and rhythm Abdomen: soft, non-tender; bowel sounds normal Pelvic: Dilation: 1.5 Effacement (%): 20 Cervical Position: Posterior Station: -3 Presentation: Vertex Exam by:: Lyndel Safe  MD  Extremities: Homans sign is negative, no sign of DVT DTR's WNL Presentation: cephalic- by US Fetal monitoringBaseline: 130 bpm, Variability: Good {> 6 bpm), Accelerations: Reactive, and Decelerations: Absent Uterine activityevery 5-7 minutes, on monitor but patient is not feeling them     Prenatal labs: ABO, Rh: AB/Positive/-- (01/10 0929) Antibody: Negative (01/10 0929) Rubella: 1.23 (01/10 0929) RPR: Non Reactive (05/21 1107)  HBsAg: Negative (01/10 0929)  HIV: Non Reactive (05/21 1107)  GBS:   UNK 1 hr Glucola WNL Genetic screening  :LR female Anatomy US WNL  Prenatal Transfer Tool  Maternal Diabetes: No Genetic Screening: Normal Maternal Ultrasounds/Referrals: Normal Fetal Ultrasounds or other Referrals:  None Maternal Substance Abuse:  No Significant Maternal Medications:  None Significant  Maternal Lab Results:  Other:  Number of Prenatal Visits:greater than 3 verified prenatal visits Other Comments:  None  Results for orders placed or performed during the hospital encounter of 08/08/22 (from the past 24 hour(s))  Rupture of Membrane (ROM) Plus   Collection Time: 08/08/22  9:55 AM  Result Value Ref Range   Rom Plus POSITIVE     Patient Active Problem List   Diagnosis Date Noted   Supervision of other normal pregnancy, antepartum 02/08/2022   Genetic carrier 11/05/2018    Assessment/Plan:  Brianna Mercado is a 22 y.o. G3P1011 at [redacted]w[redacted]d here for vaginal bleeding with ROM  #Labor: Expectant management #Pain: None currently. Plans on using support to cope with labor. Epidural OK if desired.  #FWB: Cat 1 in MAU,  BMZ given in MAU. Discussed with patient that if [facility] admission is needed infant will need transfer to [facility] or [facility]. Patient voiced understanding. Currently NICU at [facility] is full.  #ID:  GBS unknown, Preterm-- PCN started #MOF: breast and formula #MOC: Depo #Circ:  Planned inpatient   Federico Flake, MD  08/08/2022, 10:31 AM

## 2022-08-08 NOTE — Progress Notes (Signed)
Labor Progress Note Brianna Mercado is a 22 y.o. G3P1011 at [redacted]w[redacted]d by [place] presented for PROM 7/9 5:40 am.  S: Patient is doing well. She clarifies that she would like to breast and bottle feed.  O:  BP 123/71   Pulse 82   Temp 98.1 F (36.7 C) (Oral)   Resp 15   Ht 5\' 4"  (1.626 m)   Wt 80.8 kg   SpO2 99%   BMI 30.58 kg/m  EFM: ***/***/***  CVE: Dilation: 3.5 Effacement (%): 60 Cervical Position: Posterior Station: -3 Presentation: Vertex Exam by:: Dr. Ladon Applebaum   A&P: 22 y.o. [id number] [redacted]w[redacted]d *** #Labor: Progressing well. *** #Pain:  #FWB: Cat 1 #GBS {pos/neg/not AVWU:[id number]} *** (chronic/other problems)  Kayleen Memos, Medical Student 11:25 PM

## 2022-08-08 NOTE — Progress Notes (Signed)
Labor Progress Note Brianna Mercado is a 22 y.o. G3P1011 at [redacted]w[redacted]d presented for PROM 5:40 AM 7/9  S: Patient doing well, desires her cervix to be checked  O:  BP 115/70   Pulse 73   Temp 98.6 F (37 C) (Oral)   Resp 16   Ht 5\' 4"  (1.626 m)   Wt 80.8 kg   SpO2 99%   BMI 30.59 kg/m  EFM: 150 bpm/Moderate variability/ 15x15 accels/ None decels CAT: 1 Toco: Infrequent   CVE: Dilation: 3.5 Effacement (%): 50 Cervical Position: Posterior Station: -3 Presentation: Vertex Exam by:: Dr. Lanae Crumbly   A&P: 22 y.o. [id number] [redacted]w[redacted]d  here for P PROM as above  #Labor: Progressing well.  Betamethasone every 24 hours for 2 doses. #Pain: Family/Friend support #FWB: CAT 1 #GBS  pending-penicillin ongoing  Myrtie Hawk, DO FMOB Fellow, Faculty practice [facility], Center for [facility] 08/08/22  4:20 PM

## 2022-08-08 NOTE — MAU Note (Signed)
Brianna Mercado is a 22 y.o. at [redacted]w[redacted]d here in MAU reporting: she's had VB since this morning that began @ 0540 this morning.  States had a big gush of blood @ 0700.  Unsure if LOF.  Endorses +FM.  Last intercourse 2 days ago LMP: NA Onset of complaint: today Pain score: 0 Vitals:   08/08/22 0919  BP: 121/79  Pulse: 80  Resp: 18  Temp: 98.3 F (36.8 C)  SpO2: 99%     FHT:151 bpm Lab orders placed from triage:   None

## 2022-08-09 ENCOUNTER — Inpatient Hospital Stay (HOSPITAL_COMMUNITY): Payer: Medicaid Other | Admitting: Anesthesiology

## 2022-08-09 ENCOUNTER — Encounter (HOSPITAL_COMMUNITY): Payer: Self-pay | Admitting: Family Medicine

## 2022-08-09 DIAGNOSIS — Z30430 Encounter for insertion of intrauterine contraceptive device: Secondary | ICD-10-CM

## 2022-08-09 DIAGNOSIS — Z3A35 35 weeks gestation of pregnancy: Secondary | ICD-10-CM

## 2022-08-09 DIAGNOSIS — O326XX0 Maternal care for compound presentation, not applicable or unspecified: Secondary | ICD-10-CM

## 2022-08-09 DIAGNOSIS — O42013 Preterm premature rupture of membranes, onset of labor within 24 hours of rupture, third trimester: Secondary | ICD-10-CM

## 2022-08-09 DIAGNOSIS — Z975 Presence of (intrauterine) contraceptive device: Secondary | ICD-10-CM

## 2022-08-09 HISTORY — PX: IUD INSERTION: OBO1003

## 2022-08-09 MED ORDER — DIPHENHYDRAMINE HCL 25 MG PO CAPS: 25.0000 mg | ORAL_CAPSULE | Freq: Four times a day (QID) | ORAL | Status: DC | PRN

## 2022-08-09 MED ORDER — IBUPROFEN 600 MG PO TABS
600.0000 mg | ORAL_TABLET | Freq: Four times a day (QID) | ORAL | Status: DC
  Administered 2022-08-09 – 2022-08-11 (×9): 600 mg via ORAL
  Filled 2022-08-09 (×9): qty 1

## 2022-08-09 MED ORDER — SODIUM CHLORIDE 0.9 % IV SOLN: 250.0000 mL | INTRAVENOUS | Status: DC | PRN

## 2022-08-09 MED ORDER — WITCH HAZEL-GLYCERIN EX PADS: 1.0000 | MEDICATED_PAD | CUTANEOUS | Status: DC | PRN

## 2022-08-09 MED ORDER — FENTANYL-BUPIVACAINE-NACL 0.5-0.125-0.9 MG/250ML-% EP SOLN
EPIDURAL | Status: DC | PRN
  Administered 2022-08-09: 12 mL/h via EPIDURAL

## 2022-08-09 MED ORDER — ONDANSETRON HCL 4 MG/2ML IJ SOLN: 4.0000 mg | INTRAMUSCULAR | Status: DC | PRN

## 2022-08-09 MED ORDER — HYDROMORPHONE HCL 1 MG/ML IJ SOLN
2.0000 mg | Freq: Once | INTRAMUSCULAR | Status: DC
  Filled 2022-08-09: qty 2

## 2022-08-09 MED ORDER — LIDOCAINE HCL (PF) 1 % IJ SOLN
INTRAMUSCULAR | Status: DC | PRN
  Administered 2022-08-09: 5 mL via EPIDURAL

## 2022-08-09 MED ORDER — SENNOSIDES-DOCUSATE SODIUM 8.6-50 MG PO TABS
2.0000 | ORAL_TABLET | ORAL | Status: DC
  Administered 2022-08-09 – 2022-08-11 (×3): 2 via ORAL
  Filled 2022-08-09 (×3): qty 2

## 2022-08-09 MED ORDER — COCONUT OIL OIL: 1.0000 | TOPICAL_OIL | Status: DC | PRN

## 2022-08-09 MED ORDER — ACETAMINOPHEN 325 MG PO TABS: 650.0000 mg | ORAL_TABLET | ORAL | Status: DC | PRN

## 2022-08-09 MED ORDER — ONDANSETRON HCL 4 MG PO TABS: 4.0000 mg | ORAL_TABLET | ORAL | Status: DC | PRN

## 2022-08-09 MED ORDER — BENZOCAINE-MENTHOL 20-0.5 % EX AERO: 1.0000 | INHALATION_SPRAY | CUTANEOUS | Status: DC | PRN

## 2022-08-09 MED ORDER — HYDROMORPHONE HCL 1 MG/ML IJ SOLN
1.0000 mg | Freq: Once | INTRAMUSCULAR | Status: AC
  Administered 2022-08-09: 1 mg via INTRAVENOUS

## 2022-08-09 MED ORDER — LEVONORGESTREL 20 MCG/DAY IU IUD
1.0000 | INTRAUTERINE_SYSTEM | Freq: Once | INTRAUTERINE | Status: AC
  Administered 2022-08-09: 1 via INTRAUTERINE
  Filled 2022-08-09: qty 1

## 2022-08-09 MED ORDER — SODIUM CHLORIDE 0.9% FLUSH
3.0000 mL | Freq: Two times a day (BID) | INTRAVENOUS | Status: DC
  Administered 2022-08-10: 3 mL via INTRAVENOUS

## 2022-08-09 MED ORDER — PARAGARD INTRAUTERINE COPPER IU IUD: 1.0000 | INTRAUTERINE_SYSTEM | Freq: Once | INTRAUTERINE | Status: DC

## 2022-08-09 MED ORDER — PARAGARD INTRAUTERINE COPPER IU IUD
1.0000 | INTRAUTERINE_SYSTEM | Freq: Once | INTRAUTERINE | Status: DC
  Filled 2022-08-09: qty 1

## 2022-08-09 MED ORDER — ZOLPIDEM TARTRATE 5 MG PO TABS: 5.0000 mg | ORAL_TABLET | Freq: Every evening | ORAL | Status: DC | PRN

## 2022-08-09 MED ORDER — DIBUCAINE (PERIANAL) 1 % EX OINT: 1.0000 | TOPICAL_OINTMENT | CUTANEOUS | Status: DC | PRN

## 2022-08-09 MED ORDER — SIMETHICONE 80 MG PO CHEW: 80.0000 mg | CHEWABLE_TABLET | ORAL | Status: DC | PRN

## 2022-08-09 MED ORDER — PRENATAL MULTIVITAMIN CH
1.0000 | ORAL_TABLET | Freq: Every day | ORAL | Status: DC
  Administered 2022-08-09 – 2022-08-11 (×3): 1 via ORAL
  Filled 2022-08-09 (×3): qty 1

## 2022-08-09 MED ORDER — SODIUM CHLORIDE 0.9% FLUSH: 3.0000 mL | INTRAVENOUS | Status: DC | PRN

## 2022-08-09 MED ORDER — FENTANYL CITRATE (PF) 100 MCG/2ML IJ SOLN: 100.0000 ug | Freq: Once | INTRAMUSCULAR | Status: DC

## 2022-08-09 NOTE — Progress Notes (Signed)
CSW received a consult for a car seat. CSW met MOB at bedside to offer support. CSW entered the room, introduced herself and explained the reason for the consult. MOB was polite, easy to engage, receptive to meeting with CSW, and appeared forthcoming.  CSW asked MOB did she need a carseat; MOB said yes and reported needing a pack n play as well. CSW informed MOB of the $30 cost associated with the carseat. MOB reported not having the cost, CSW provided the carseat at bedside to [facility]. CSW informed MOB at the moment the hospital does not have any pack n play's  in stock; however will provide resources to organizations that can assist and will contact family connect to see if they are available to assist upon discharge; MOB was understanding.  CSW contacted family connect and they will connect with MOB tomorrow (08-10-2022) at bedside.  CSW identifies no further need for intervention and no barriers to discharge at this time.  Enos Fling, Theresia Majors Clinical Social Worker [phone]

## 2022-08-09 NOTE — Lactation Note (Addendum)
This note was copied from a baby's chart. Lactation Consultation Note  Patient Name: Brianna Mercado [id number] Date: 08/09/2022 Age:23 hours Reason for consult: Initial assessment;Late-preterm 34-36.6wks;Infant < 6lbs  P2, Baby 36 weeks.  Baby breastfed well in L&D but now is sleepy.  BS have been 46, 38.  Reviewed hand expression and set up DEBP with 24 flanges. Gave baby 2ml on finger/spoon.  Baby spitty.  Suggest giving him the remaining 6 ml of colostrum in 2 hours and pump in 3 hours.  Left baby skin to skin on mother's chest.  Plan: Keep baby STS as much as possible  Offer breast when baby cues that he is hungry, or awaken baby for feeding at 3 hrs.  Breast feed baby, asking for help prn.  Limit to 30 mins so not to overtire baby. If baby does not latch after 10 min of attempt - give supplemental breastmilk/formula.  Slow flow nipple bottle is an option.   Pump both breasts 15 minutes on initiation setting, adding hand expression to collect as much colostrum as possible to feed baby.  Feed baby 12-14 ml EBM+/formula after breastfeeding per LPTI volume guidelines increasing per day of life and as baby desires.  [facility] referral sent.  Mother has [employer] so no stork pump.  Suggest she call her insurance.     Maternal Data Has patient been taught Hand Expression?: Yes Does the patient have breastfeeding experience prior to this delivery?: Yes How long did the patient breastfeed?: 4 mos.  Feeding Mother's Current Feeding Choice: Breast Milk and Formula Nipple Type: Nfant Standard Flow (white)  LATCH Score Latch: Too sleepy or reluctant, no latch achieved, no sucking elicited.  Audible Swallowing: None  Type of Nipple: Everted at rest and after stimulation  Comfort (Breast/Nipple): Soft / non-tender  Hold (Positioning): Assistance needed to correctly position infant at breast and maintain latch.  LATCH Score: 5   Lactation Tools Discussed/Used Tools:  Pump;Flanges  Interventions Interventions: Breast feeding basics reviewed;Assisted with latch;Skin to skin;Hand express;Support pillows;DEBP;Education;Pace feeding;LC Services brochure;LPT handout/interventions  Discharge Pump:  [facility] pump referral faxed)  Consult Status Consult Status: Follow-up Date: 08/10/22 Follow-up type: In-patient    Dahlia Byes [facility] 08/09/2022, 3:13 PM

## 2022-08-09 NOTE — Anesthesia Preprocedure Evaluation (Signed)
Anesthesia Evaluation  Patient identified by MRN, date of birth, ID band Patient awake    Reviewed: Allergy & Precautions, NPO status , Patient's Chart, lab work & pertinent test results  Airway Mallampati: II  TM Distance: >3 FB Neck ROM: Full    Dental no notable dental hx.    Pulmonary neg pulmonary ROS   Pulmonary exam normal breath sounds clear to auscultation       Cardiovascular negative cardio ROS Normal cardiovascular exam Rhythm:Regular Rate:Normal     Neuro/Psych negative neurological ROS     GI/Hepatic negative GI ROS, Neg liver ROS,,,  Endo/Other  negative endocrine ROS    Renal/GU negative Renal ROS     Musculoskeletal   Abdominal   Peds  Hematology Lab Results      Component                Value               Date                      WBC                      8.8                 08/08/2022                HGB                      11.4 (L)            08/08/2022                HCT                      36.1                08/08/2022                MCV                      84.9                08/08/2022                PLT                      310                 08/08/2022              Anesthesia Other Findings   Reproductive/Obstetrics (+) Pregnancy                             Anesthesia Physical Anesthesia Plan  ASA: 2  Anesthesia Plan: Epidural   Post-op Pain Management:    Induction:   PONV Risk Score and Plan:   Airway Management Planned:   Additional Equipment:   Intra-op Plan:   Post-operative Plan:   Informed Consent: I have reviewed the patients History and Physical, chart, labs and discussed the procedure including the risks, benefits and alternatives for the proposed anesthesia with the patient or authorized representative who has indicated his/her understanding and acceptance.       Plan Discussed with:   Anesthesia Plan Comments: (36 Wk G3p1  For LEA)  Anesthesia Quick Evaluation

## 2022-08-09 NOTE — Procedures (Signed)
  Post-Placental IUD Insertion Procedure Note  Patient identified, informed consent signed prior to delivery, signed copy in chart, time out was performed.    Vaginal, labial and perineal areas thoroughly inspected for lacerations. None noted prior to insertion of IUD.    Mirena IUD grasped between sterile gloved fingers. Sterile lubrication applied to sterile gloved hand for ease of insertion. Fundus identified through abdominal wall using non-insertion hand. IUD inserted to fundus with bimanual technique. IUD carefully released at the fundus and insertion hand gently removed from vagina.    Strings trimmed to the level of the introitus. Patient tolerated procedure well.  Patient given post procedure instructions and IUD care card with expiration date.  Patient is asked to keep IUD strings tucked in her vagina until her postpartum follow up visit in 4-6 weeks. Patient advised to abstain from sexual intercourse and pulling on strings before her follow-up visit. Patient verbalized an understanding of the plan of care and agrees.

## 2022-08-09 NOTE — Anesthesia Procedure Notes (Signed)
Epidural Patient location during procedure: OB Start time: 08/09/2022 3:55 AM End time: 08/09/2022 4:09 AM  Staffing Anesthesiologist: Trevor Iha, MD Performed: anesthesiologist   Preanesthetic Checklist Completed: patient identified, IV checked, site marked, risks and benefits discussed, surgical consent, monitors and equipment checked, pre-op evaluation and timeout performed  Epidural Patient position: sitting Prep: DuraPrep and site prepped and draped Patient monitoring: continuous pulse ox and blood pressure Approach: midline Location: L3-L4 Injection technique: LOR air  Needle:  Needle type: Tuohy  Needle gauge: 17 G Needle length: 9 cm and 9 Needle insertion depth: 8 cm Catheter type: closed end flexible Catheter size: 19 Gauge Catheter at skin depth: 13 cm Test dose: negative  Assessment Events: blood not aspirated, no cerebrospinal fluid, injection not painful, no injection resistance, no paresthesia and negative IV test  Additional Notes Patient identified. Risks/Benefits/Options discussed with patient including but not limited to bleeding, infection, nerve damage, paralysis, failed block, incomplete pain control, headache, blood pressure changes, nausea, vomiting, reactions to medication both or allergic, itching and postpartum back pain. Confirmed with bedside nurse the patient's most recent platelet count. Confirmed with patient that they are not currently taking any anticoagulation, have any bleeding history or any family history of bleeding disorders. Patient expressed understanding and wished to proceed. All questions were answered. Sterile technique was used throughout the entire procedure. Please see nursing notes for vital signs. Test dose was given through epidural needle and negative prior to continuing to dose epidural or start infusion. Warning signs of high block given to the patient including shortness of breath, tingling/numbness in hands, complete  motor block, or any concerning symptoms with instructions to call for help. Patient was given instructions on fall risk and not to get out of bed. All questions and concerns addressed with instructions to call with any issues. 1 Attempt (S) . Patient tolerated procedure well.

## 2022-08-09 NOTE — Discharge Summary (Addendum)
Postpartum Discharge Summary  Date of Service updated 08/10/22     Patient Name: Brianna Mercado DOB: January 11, 2001 MRN: [id number]  Date of admission: 08/08/2022 Delivery date:08/09/2022 Delivering provider: Myrtie Hawk Date of discharge: 08/10/2022  Admitting diagnosis: PROM (premature rupture of membranes) [O42.90] Intrauterine pregnancy: [redacted]w[redacted]d     Secondary diagnosis:  Principal Problem:   Preterm delivery Active Problems:   Supervision of other normal pregnancy, antepartum   PROM (premature rupture of membranes)   Encounter for IUD insertion   IUD (intrauterine device) in place  Additional problems: None    Discharge diagnosis: Preterm Pregnancy Delivered                                              Post partum procedures: Post Partum IUD Augmentation: Pitocin Complications: None  Hospital course: Onset of Labor With Vaginal Delivery      22 y.o. yo G3P1011 at [redacted]w[redacted]d was admitted in Latent Labor on 08/08/2022. Labor course was complicated by none  Membrane Rupture Time/Date: 5:40 AM,08/08/2022  Delivery Method:Vaginal, Spontaneous Episiotomy: None Lacerations:  None Patient had an uncomplicated postpartum course.  She is ambulating, tolerating a regular diet, passing flatus, and urinating well. Initially pt was going to discharge home on 7/11, but needed to stay due to pediatric concerns.  Newborn Data: Birth date:08/09/2022 Birth time:8:53 AM Gender:Female Living status:Living Apgars:9 ,9  Weight:2650 g  Magnesium Sulfate received: No BMZ received: Yes Rhophylac:N/A MMR:N/A T-DaP:Given prenatally Flu: No Transfusion:No  Physical exam  Vitals:   08/09/22 1605 08/09/22 2046 08/09/22 2356 08/10/22 0512  BP: 108/74 109/70 109/70 115/72  Pulse: 76 87 79 78  Resp: 18 16 16 18   Temp: 98 F (36.7 C) 98.1 F (36.7 C) 98.2 F (36.8 C) 98.1 F (36.7 C)  TempSrc: Oral Oral Oral Oral  SpO2: 99% 100% 100% 100%  Weight:      Height:       General: alert,  cooperative, and no distress Lochia: appropriate Uterine Fundus: firm Incision: N/A DVT Evaluation: No evidence of DVT seen on physical exam. No cords or calf tenderness. Labs: Lab Results  Component Value Date   WBC 12.7 (H) 08/10/2022   HGB 9.2 (L) 08/10/2022   HCT 28.5 (L) 08/10/2022   MCV 86.4 08/10/2022   PLT 300 08/10/2022       No data to display         Edinburgh Score:    08/10/2022    8:14 AM  Edinburgh Postnatal Depression Scale Screening Tool  I have been able to laugh and see the funny side of things. 0  I have looked forward with enjoyment to things. 0  I have blamed myself unnecessarily when things went wrong. 2  I have been anxious or worried for no good reason. 1  I have felt scared or panicky for no good reason. 0  Things have been getting on top of me. 2  I have been so unhappy that I have had difficulty sleeping. 1  I have felt sad or miserable. 2  I have been so unhappy that I have been crying. 1  The thought of harming myself has occurred to me. 0  Edinburgh Postnatal Depression Scale Total 9     After visit meds:  Allergies as of 08/10/2022   No Known Allergies      Medication List  STOP taking these medications    ACCRUFeR 30 MG Caps Generic drug: Ferric Maltol       TAKE these medications    acetaminophen 325 MG tablet Commonly known as: Tylenol Take 2 tablets (650 mg total) by mouth every 4 (four) hours as needed (for pain scale < 4).   benzocaine-Menthol 20-0.5 % Aero Commonly known as: DERMOPLAST Apply 1 Application topically as needed for irritation (perineal discomfort).   Blood Pressure Kit Devi 1 Device by Does not apply route once a week.   ibuprofen 600 MG tablet Commonly known as: ADVIL Take 1 tablet (600 mg total) by mouth every 6 (six) hours.   Prenatal Plus Vitamin/Mineral 27-1 MG Tabs Take 1 tablet by mouth daily before breakfast.   senna-docusate 8.6-50 MG tablet Commonly known as: Senokot-S Take 2  tablets by mouth daily.         Discharge home in stable condition Infant Feeding: Breast Infant Disposition:home with mother Discharge instruction: per After Visit Summary and Postpartum booklet. Activity: Advance as tolerated. Pelvic rest for 6 weeks.  Diet: routine diet Future Appointments: Future Appointments  Date Time Provider Department Center  09/21/2022  3:10 PM Hessie Dibble, MD [facility] None   Follow up Visit: Message sent to [facility] 7/10  Please schedule this patient for a In person postpartum visit in 6 weeks with the following provider: Any provider. Additional Postpartum F/U: n/a Low risk pregnancy complicated by:  n/a Delivery mode:  Vaginal, Spontaneous Anticipated Birth Control:  Depo    08/10/2022 Laurie Panda, MD PGY-3 Visiting Resident

## 2022-08-09 NOTE — Lactation Note (Signed)
This note was copied from a baby's chart. Lactation Consultation Note  Patient Name: Boy Iantha Titsworth ZOXWR'U Date: 08/09/2022 Age:23 hours Reason for consult: Follow-up assessment;RN request;Infant < 6lbs;Late-preterm 34-36.6wks;Other (Comment) (hypoglycemia) RN asked LC to see baby d/t 36 wks and will not eat and Hypoglycemia.  Mom was holding baby STS. Baby very sleepy. LC took colostrum that was left over from what baby took from bottle earlier and used gloved finger for massaging colostrum on oral mucosa. LC attempted suck training and baby had no interest in sucking at all. Stimulation to get baby to wake up but is very sleepy. LC massaged cheeks to get him to swallow few drops of colostrum. Attempted bottle baby wouldn't suck. Placed baby back on mom's chest STS and reported to RN.  Maternal Data    Feeding Nipple Type: Nfant Standard Flow (white)  LATCH Score Latch: Too sleepy or reluctant, no latch achieved, no sucking elicited.  Audible Swallowing: None  Type of Nipple: Everted at rest and after stimulation  Comfort (Breast/Nipple): Soft / non-tender  Hold (Positioning): No assistance needed to correctly position infant at breast.  LATCH Score: 6   Lactation Tools Discussed/Used    Interventions Interventions: DEBP;Breast feeding basics reviewed;Skin to skin;Hand express;Support pillows;Expressed milk;Education  Discharge    Consult Status Consult Status: Follow-up Date: 08/10/22 Follow-up type: In-patient    Charyl Dancer 08/09/2022, 9:30 PM

## 2022-08-09 NOTE — Lactation Note (Signed)
This note was copied from a baby's chart. Lactation Consultation Note  Patient Name: Boy Seryna Marek ZOXWR'U Date: 08/09/2022 Age:23 hours Reason for consult: Initial assessment;1st time breastfeeding;Late-preterm 34-36.6wks;RN request  P1, Baby 36 weeks.  Called to room to assist with breastfeeding.  When [facility] entered room baby was latched with flanged lips deep on breast.  Mother states he opened wide to latch. Discussed feeding at least q 3 hours and limiting time at the breast to 30 min.  Mother will be set up with DEBP when mother goes to floor.  LC will sent WIC referral.     Maternal Data Does the patient have breastfeeding experience prior to this delivery?: No   LATCH Score Latch: Repeated attempts needed to sustain latch, nipple held in mouth throughout feeding, stimulation needed to elicit sucking reflex.  Audible Swallowing: A few with stimulation  Type of Nipple: Everted at rest and after stimulation  Comfort (Breast/Nipple): Soft / non-tender  Hold (Positioning): Full assist, staff holds infant at breast  LATCH Score: 6   Interventions Interventions: Breast feeding basics reviewed;Assisted with latch;Skin to skin;Support pillows;Position options   Consult Status Consult Status: Follow-up from L&D    Dahlia Byes [facility] 08/09/2022, 9:41 AM

## 2022-08-10 LAB — CBC
HCT: 28.5 % — ABNORMAL LOW (ref 36.0–46.0)
Hemoglobin: 9.2 g/dL — ABNORMAL LOW (ref 12.0–15.0)
MCH: 27.9 pg (ref 26.0–34.0)
MCHC: 32.3 g/dL (ref 30.0–36.0)
MCV: 86.4 fL (ref 80.0–100.0)
Platelets: 300 10*3/uL (ref 150–400)
RBC: 3.3 MIL/uL — ABNORMAL LOW (ref 3.87–5.11)
RDW: 13.5 % (ref 11.5–15.5)
WBC: 12.7 10*3/uL — ABNORMAL HIGH (ref 4.0–10.5)
nRBC: 0 % (ref 0.0–0.2)

## 2022-08-10 LAB — SURGICAL PATHOLOGY

## 2022-08-10 MED ORDER — IBUPROFEN 600 MG PO TABS: 600.0000 mg | ORAL_TABLET | Freq: Four times a day (QID) | ORAL | 0 refills | Status: AC

## 2022-08-10 MED ORDER — SENNOSIDES-DOCUSATE SODIUM 8.6-50 MG PO TABS: 2.0000 | ORAL_TABLET | ORAL | 0 refills | Status: AC

## 2022-08-10 MED ORDER — BENZOCAINE-MENTHOL 20-0.5 % EX AERO: 1.0000 | INHALATION_SPRAY | CUTANEOUS | 0 refills | Status: AC | PRN

## 2022-08-10 MED ORDER — ACETAMINOPHEN 325 MG PO TABS: 650.0000 mg | ORAL_TABLET | ORAL | 0 refills | Status: AC | PRN

## 2022-08-10 NOTE — Lactation Note (Signed)
This note was copied from a baby's chart. Lactation Consultation Note  Patient Name: Brianna Mercado IRJJO'A Date: 08/10/2022 Age:23 hours Reason for consult: Follow-up assessment;Late-preterm 34-36.6wks;Infant < 6lbs  P2, Baby [redacted]w[redacted]d  PMA.  Mother recently pumped 15 ml and gave to baby.  She has also been supplementing with formula.  Encouraged mother to continue to post pump and feed baby at least 3 hours.  WIC referral was sent yesterday and WIC called and could not see mother until the 29th to get DEBP.  Mother has [facility] and does not qualify for [employer] pump.  Mother has since called the [facility] office in [facility] and they may be able to give mother DEBP sooner.  Encouraged mother to be sure to take pump parts home.  Mother will call for help as needed.  Maternal Data Has patient been taught Hand Expression?: Yes  Feeding Mother's Current Feeding Choice: Breast Milk and Formula Nipple Type: Extra Slow Flow  Lactation Tools Discussed/Used Tools: Pump;Flanges Flange Size: 24 Breast pump type: Double-Electric Breast Pump;Manual Reason for Pumping: LPI Pumping frequency: q 3 hours Pumped volume: 15 mL  Interventions Interventions: Education;DEBP  Discharge Pump: Manual [facility] referral sent)  Consult Status Consult Status: Follow-up Date: 08/11/22 Follow-up type: In-patient    Dahlia Byes [facility] 08/10/2022, 4:42 PM

## 2022-08-10 NOTE — Progress Notes (Signed)
Patient called out, said she passed a large clot.  Her RN was in another room admitting her new patients.  I went into the room to check on patient and to attempt to quantify the clot.  I was unable to isolate clot to possibly weigh it.  Patient stated that it was probably around golf ball sized.   Asked patient when the last time she urinated was.  She stated that it had been a long time.  I encouraged her to urinate/empty her bladder every 2 to 3 hours. Informed Brooke (her RN) when she came out of the other room.

## 2022-08-10 NOTE — Anesthesia Postprocedure Evaluation (Signed)
Anesthesia Post Note  Patient: Brianna Mercado  Procedure(s) Performed: AN AD HOC LABOR EPIDURAL     Patient location during evaluation: Mother Baby Anesthesia Type: Epidural Level of consciousness: awake and alert and oriented Pain management: satisfactory to patient Vital Signs Assessment: post-procedure vital signs reviewed and stable Respiratory status: respiratory function stable Cardiovascular status: stable Postop Assessment: no headache, no backache, epidural receding, patient able to bend at knees, no signs of nausea or vomiting, adequate PO intake and able to ambulate Anesthetic complications: no   No notable events documented.  Last Vitals:  Vitals:   08/09/22 2356 08/10/22 0512  BP: 109/70 115/72  Pulse: 79 78  Resp: 16 18  Temp: 36.8 C 36.7 C  SpO2: 100% 100%    Last Pain:  Vitals:   08/10/22 0814  TempSrc:   PainSc: 0-No pain   Pain Goal:                Epidural/Spinal Function Cutaneous sensation: Normal sensation (08/10/22 0814), Patient able to flex knees: Yes (08/10/22 0814), Patient able to lift hips off bed: Yes (08/10/22 0814), Back pain beyond tenderness at insertion site: No (08/10/22 0814), Progressively worsening motor and/or sensory loss: No (08/10/22 0814), Bowel and/or bladder incontinence post epidural: No (08/10/22 1610)  Karleen Dolphin

## 2022-08-11 ENCOUNTER — Ambulatory Visit (HOSPITAL_COMMUNITY): Payer: Self-pay

## 2022-08-11 NOTE — Discharge Summary (Addendum)
Postpartum Discharge Summary  Date of Service updated 08/11/22     Patient Name: Brianna Mercado DOB: 06-24-2000 MRN: [id number]  Date of admission: 08/08/2022 Delivery date:08/09/2022 Delivering provider: Myrtie Hawk Date of discharge: 08/11/2022  Admitting diagnosis: PROM (premature rupture of membranes) [O42.90] Intrauterine pregnancy: [redacted]w[redacted]d     Secondary diagnosis:  Principal Problem:   Preterm delivery Active Problems:   Supervision of other normal pregnancy, antepartum   PROM (premature rupture of membranes)   Encounter for IUD insertion   IUD (intrauterine device) in place  Additional problems: None    Discharge diagnosis: Preterm Pregnancy Delivered                                              Post partum procedures: Post Partum IUD Augmentation: Pitocin Complications: None  Hospital course: Onset of Labor With Vaginal Delivery      22 y.o. yo G3P1011 at [redacted]w[redacted]d was admitted in Latent Labor on 08/08/2022. Labor course was complicated by none  Membrane Rupture Time/Date: 5:40 AM,08/08/2022  Delivery Method:Vaginal, Spontaneous Episiotomy: None Lacerations:  None Patient had an uncomplicated postpartum course.  She is ambulating, tolerating a regular diet, passing flatus, and urinating well. Patient is discharged home in stable condition on 08/11/22.  Newborn Data: Birth date:08/09/2022 Birth time:8:53 AM Gender:Female Living status:Living Apgars:9 ,9  Weight:2650 g  Magnesium Sulfate received: No BMZ received: Yes Rhophylac:N/A MMR:N/A T-DaP:Given prenatally Flu: No Transfusion:No  Physical exam  Vitals:   08/10/22 0512 08/10/22 1359 08/10/22 2048 08/11/22 0541  BP: 115/72 102/61 110/67 112/67  Pulse: 78 86 66 60  Resp: 18 16 16 16   Temp: 98.1 F (36.7 C) 98.7 F (37.1 C) 99.3 F (37.4 C) 98.2 F (36.8 C)  TempSrc: Oral Oral Oral Oral  SpO2: 100% 100%    Weight:      Height:       General: alert, cooperative, and no distress CV:  RRR Lungs: CTAB Lochia: appropriate Uterine Fundus: firm Incision: N/A DVT Evaluation: No evidence of DVT seen on physical exam. No cords or calf tenderness. Labs: Lab Results  Component Value Date   WBC 12.7 (H) 08/10/2022   HGB 9.2 (L) 08/10/2022   HCT 28.5 (L) 08/10/2022   MCV 86.4 08/10/2022   PLT 300 08/10/2022       No data to display         Edinburgh Score:    08/10/2022    8:14 AM  Edinburgh Postnatal Depression Scale Screening Tool  I have been able to laugh and see the funny side of things. 0  I have looked forward with enjoyment to things. 0  I have blamed myself unnecessarily when things went wrong. 2  I have been anxious or worried for no good reason. 1  I have felt scared or panicky for no good reason. 0  Things have been getting on top of me. 2  I have been so unhappy that I have had difficulty sleeping. 1  I have felt sad or miserable. 2  I have been so unhappy that I have been crying. 1  The thought of harming myself has occurred to me. 0  Edinburgh Postnatal Depression Scale Total 9     After visit meds:  Allergies as of 08/11/2022   No Known Allergies      Medication List     STOP taking these medications  ACCRUFeR 30 MG Caps Generic drug: Ferric Maltol       TAKE these medications    acetaminophen 325 MG tablet Commonly known as: Tylenol Take 2 tablets (650 mg total) by mouth every 4 (four) hours as needed (for pain scale < 4).   benzocaine-Menthol 20-0.5 % Aero Commonly known as: DERMOPLAST Apply 1 Application topically as needed for irritation (perineal discomfort).   Blood Pressure Kit Devi 1 Device by Does not apply route once a week.   ibuprofen 600 MG tablet Commonly known as: ADVIL Take 1 tablet (600 mg total) by mouth every 6 (six) hours.   Prenatal Plus Vitamin/Mineral 27-1 MG Tabs Take 1 tablet by mouth daily before breakfast.   senna-docusate 8.6-50 MG tablet Commonly known as: Senokot-S Take 2 tablets by  mouth daily.         Discharge home in stable condition Infant Feeding: Breast Infant Disposition:home with mother Discharge instruction: per After Visit Summary and Postpartum booklet. Activity: Advance as tolerated. Pelvic rest for 6 weeks.  Diet: routine diet Future Appointments: Future Appointments  Date Time Provider Department Center  09/21/2022  3:10 PM Hessie Dibble, MD [facility] None   Follow up Visit:  Follow-up Information     [facility] for [facility] Follow up in 5 week(s).   Specialty: Obstetrics and Gynecology Why: Please follow up in 5-6wks for a postpartum visit Contact information: [address] [place] [address] [phone]               Message sent to [facility] 7/10  Please schedule this patient for a In person postpartum visit in 6 weeks with the following provider: Any provider. Additional Postpartum F/U: n/a Low risk pregnancy complicated by:  n/a Delivery mode:  Vaginal, Spontaneous Anticipated Birth Control:  Depo    Myna Hidalgo, DO Attending Obstetrician & Gynecologist, Faculty Practice Center for [employer], [facility]

## 2022-08-11 NOTE — Lactation Note (Signed)
This note was copied from a baby's chart. Lactation Consultation Note  Patient Name: Brianna Mercado [id number] Date: 08/11/2022 Age:23 hours Reason for consult: Follow-up assessment.  P2, LPTI with weight loss of -6.38%, LC discussed LPTI feeding guidelines, Birth Parent has not been supplementing with each feeding. LC discussed to limit chest feeding to 15 minutes or less to reserve energy and supplement infant with each feeding with her EBN first and then 22 kcal formula. Birth Parent latched infant on her left breast for 15 minutes, infant latched with depth and sustained latch and infant would only consume 8 mls of EBM then became sleepy. Birth Parent will limit next feeding to 10 minutes at breast to see if infant will consume more volume, infant is using White Nfant nipple. Birth Parent was still pumping when LC left the room and had expressed 60 mls. Birth Parent has not been pumping every 3 hours but plans to going forward. Birth Parent has 200 mls of EBM in fridge, LC discussed to offer her EBM first before formula. Family doesn't qualify for [facility] Pump Support Person is planning to purchase one was looking at [employer].   Birth Parent knows that her EBM is safe at room temperature for 4 hours whereas formula must be used within 1 hour once open.   Current feeding plan: 1- BF infant according to LPTI feeding guidelines with feed infant every 3 hours and limit feedings to 30 minutes or less, plans to chest feed infant ( breastfeed) for 10 minutes and afterwards supplement infant with 28 or more mls of EBM first before offering formula.   2- Birth Parent knows on Day 3 to supplement infant with (26-28 mls) of EBM/ formula per feeding after infant latches at the breast. 3- Birth Parent will continue to use DEBP every 3 hours as advised.  Maternal Data    Feeding Mother's Current Feeding Choice: Breast Milk and Formula  LATCH Score Latch: Grasps breast easily, tongue down, lips  flanged, rhythmical sucking.  Audible Swallowing: A few with stimulation  Type of Nipple: Everted at rest and after stimulation  Comfort (Breast/Nipple): Soft / non-tender  Hold (Positioning): Assistance needed to correctly position infant at breast and maintain latch.  LATCH Score: 8   Lactation Tools Discussed/Used Tools: Pump Flange Size: 24 Breast pump type: Double-Electric Breast Pump Reason for Pumping: Infant is LPTI Pumping frequency: Will continue to pump every 3 hours for 15 minutes Pumped volume: 60 mL (still expressing colostrum when LC left the room.)  Interventions Interventions: Expressed milk;Breast massage;Position options;Skin to skin;Assisted with latch;Support pillows;Adjust position;Breast compression  Discharge    Consult Status Consult Status: Follow-up Date: 08/12/22 Follow-up type: In-patient    Frederico Hamman 08/11/2022, 5:42 PM

## 2022-08-12 ENCOUNTER — Ambulatory Visit (HOSPITAL_COMMUNITY): Payer: Self-pay

## 2022-08-12 NOTE — Lactation Note (Signed)
This note was copied from a baby's chart. Lactation Consultation Note  Patient Name: Brianna Mercado [id number] Date: 08/12/2022 Age:23 hours Reason for consult: Follow-up assessment;Late-preterm 34-36.6wks  P2, Baby 36 weeks and sleeping. Weight increased.  Mother has been pumping 60 ml + q 3 hours.  Discussed continue to give breastmilk volume first before latching.  Last feeding baby took 27 ml with purple nfant nipple.  Goal 30-35 ml increasing per day of life and as baby desires.  Mother will continue to latch baby after bottle of breastmilk limiting feedings to 30 min to not overtire baby.  Reviewed engorgement care and monitoring voids/stools.  Maternal Data Has patient been taught Hand Expression?: Yes  Feeding Mother's Current Feeding Choice: Breast Milk and Formula Nipple Type: Nfant Standard Flow (white)  LATCH Score Latch: Grasps breast easily, tongue down, lips flanged, rhythmical sucking.  Audible Swallowing: Spontaneous and intermittent  Type of Nipple: Everted at rest and after stimulation  Comfort (Breast/Nipple): Soft / non-tender  Hold (Positioning): No assistance needed to correctly position infant at breast.  LATCH Score: 10   Lactation Tools Discussed/Used Breast pump type: Double-Electric Breast Pump Pumping frequency:  (q 3 hours) Pumped volume: 60 mL  Interventions Interventions: Education  Discharge Discharge Education: Engorgement and breast care;Warning signs for feeding baby Pump:  (Will purchase DEBP, has manual)  Consult Status Consult Status: Complete Date: 08/13/22    Dahlia Byes Boschen  RN IBCLC 08/12/2022, 12:08 PM

## 2022-08-14 ENCOUNTER — Encounter: Payer: Medicaid Other | Admitting: Family Medicine

## 2022-08-21 ENCOUNTER — Encounter: Payer: Medicaid Other | Admitting: Advanced Practice Midwife

## 2022-08-24 ENCOUNTER — Ambulatory Visit: Payer: Self-pay

## 2022-08-24 NOTE — Lactation Note (Cosign Needed)
This note was copied from a baby's chart. 41 week old LPTI  presents with mom for weight check and a feeding assessment. Mom reports following supplementation guidelines and her milk supply is in and increasing; infant is gaining weight. Infant gets sleepy with feeds after about 5-6 minutes and then drifts off to sleep; mom has to actively keep infant awake during feedings to reach the 15 minute mark.   Patient is supplementing with formula every other feed, alternating with her breast milk; infant is receiving supplementation after every feeding at the breast. LC assessed after infant fed for 15 minutes, infant was struggling to stay awake and alert during feedings. Mom has a strong let down and it can be overwhelming for infant at the start of the feeds. LC encouraged mom to pump until letdown.   Observed a 15 minute pump session; mom yielded 170 mls of breast milk. Fed infant approximately 50 mls back. Patient currently is using a hand pump at home but anticipates receiving a DEBP in the next week. Mom desires to supplement feedings with breastmilk exclusively and save formula for emergencies   Infant has gained 91 grams in the last 24 hours day with an average daily weight gain of 3 oz a day.     Infant oral assessment with sucking blister to center upper lip. Infant with high labial frenulum, upper lip and upper gum ridge blanches with flanging.Infant displayed no issues with feeding while at the breast during this feeding. Infant also presents with a mild posterior lingual frenulum. Infant able to stick tongue over gumline and can elevate and lateralize the tongue. Along with above symptoms, discussed benefits of infant being evaluated by Pediatric Dentist to rule out TOTS. Website and local provider information given.    Infant to follow up with [facility] on 8/9 for 1 month visit Infant to follow up with Lactation on that date

## 2022-08-28 ENCOUNTER — Encounter: Payer: Medicaid Other | Admitting: Obstetrics and Gynecology

## 2022-09-04 ENCOUNTER — Telehealth (HOSPITAL_COMMUNITY): Payer: Self-pay | Admitting: *Deleted

## 2022-09-04 ENCOUNTER — Encounter: Payer: Medicaid Other | Admitting: Obstetrics and Gynecology

## 2022-09-04 NOTE — Telephone Encounter (Signed)
09/04/2022  Name: Brianna Mercado MRN: [id number] DOB: April 26, 2000  Reason for Call:  Transition of Care Hospital Discharge Call  Contact Status: Patient Contact Status: Complete  Language assistant needed:          Follow-Up Questions: Do You Have Any Concerns About Your Health As You Heal From Delivery?: No Do You Have Any Concerns About Your Infants Health?: No  Edinburgh Postnatal Depression Scale:  In the Past 7 Days: I have been able to laugh and see the funny side of things.: As much as I always could I have looked forward with enjoyment to things.: Rather less than I used to I have blamed myself unnecessarily when things went wrong.: Yes, some of the time I have been anxious or worried for no good reason.: Hardly ever I have felt scared or panicky for no good reason.: No, not at all Things have been getting on top of me.: No, most of the time I have coped quite well I have been so unhappy that I have had difficulty sleeping.: Not very often I have felt sad or miserable.: Not very often I have been so unhappy that I have been crying.: Yes, quite often The thought of harming myself has occurred to me.: Never Edinburgh Postnatal Depression Scale Total: 9 PHQ2-9 Depression Scale:     Discharge Follow-up: Edinburgh score requires follow up?: No Patient was advised of the following resources:: Breastfeeding Support Group, Support Group Patient referred to:: Other (comment) Patient verbalized being stressed about finding housing. Stated, "Most of the time I just live in my car with my 3yo and the baby. My mom lets me stay with her, but I feel like a burden. I'm working to find good housing." RN discussed patient's desire for counseling. Patient requested counseling services to help her manage her current stressors. In-basket message sent to Irven Baltimore, LCSW with Integrated Behavioral Health. Post-discharge interventions: Reviewed Newborn Safe Sleep  Practices  Signature Deforest Hoyles, RN [phone]

## 2022-09-05 NOTE — BH Specialist Note (Unsigned)
Integrated Behavioral Health via Telemedicine Visit  09/06/2022 Brianna Mercado [id number]  Number of Integrated Behavioral Health Clinician visits: 1- Initial Visit  Session Start time: 1200   Session End time: 1238  Total time in minutes: 38   Referring Provider: Catalina Antigua, MD Patient/Family location: Home Brianna Mercado Provider location: [facility] for [facility] at Brianna Mercado for Mercado  All persons participating in visit: Patient Brianna Mercado and [facility]   Types of Service: Individual psychotherapy and Video visit  I connected with Brianna Mercado and/or Brianna Mercado's  n/a  via  Telephone or Video Enabled Telemedicine Application  (Video is Caregility application) and verified that I am speaking with the correct person using two identifiers. Discussed confidentiality: Yes   I discussed the limitations of telemedicine and the availability of in person appointments.  Discussed there is a possibility of technology failure and discussed alternative modes of communication if that failure occurs.  I discussed that engaging in this telemedicine visit, they consent to the provision of behavioral healthcare and the services will be billed under their insurance.  Patient and/or legal guardian expressed understanding and consented to Telemedicine visit: Yes   Presenting Concerns: Patient and/or family reports the following symptoms/concerns: Life stress and passive SI with no intent and no plan; goals are to find permanent housing (currently living with her mother and "feel like a burden");  transportation (repairs needed on car); need childcare for newborn or work-from-home job; complete degree (one year left, on temporary leave).  Duration of problem: Perinatal; Severity of problem:  moderately severe  Patient and/or Family's Strengths/Protective Factors: Concrete supports in place (healthy food, safe environments, etc.), Sense of purpose, and Physical  Health (exercise, healthy diet, medication compliance, etc.)  Goals Addressed: Patient will:  Reduce symptoms of: anxiety, depression, and stress   Increase knowledge and/or ability of: stress reduction   Demonstrate ability to: Increase healthy adjustment to [name], Increase adequate support systems for patient/family, and Increase motivation to adhere to plan of care  Progress towards Goals: Ongoing  Interventions: Interventions utilized:  Solution-Focused Strategies, Psychoeducation and/or Health Education, and Link to [employer] Standardized Assessments completed: C-SSRS Short, GAD-7, and PHQ 9  Patient and/or Family Response: Patient agrees with treatment plan.   Assessment: Patient currently experiencing Adjustment disorder with mixed anxious and depressed mood; Psychosocial stress.   Patient may benefit from psychoeducation and brief therapeutic interventions regarding coping with symptoms of depression, anxiety; life stress .  Plan: Follow up with behavioral health clinician on : Two weeks Behavioral recommendations:  -Continue prioritizing healthy self-care (regular meals, adequate rest; allowing practical help from supportive friends and family) until at least postpartum medical appointment -Consider new mom support group as needed at either www.postpartum.net or www.conehealthybaby.com  -Read through housing, transportation and childcare resources on After Visit Summary; use as needed -Continue working with [facility] agency -Consider looking at local school options to transfer credits  Referral(s): Integrated Art gallery manager (In Clinic) and [employer]:  Housing and Transportation  I discussed the assessment and treatment plan with the patient and/or parent/guardian. They were provided an opportunity to ask questions and all were answered. They agreed with the plan and demonstrated an understanding of the  instructions.   They were advised to call back or seek an in-person evaluation if the symptoms worsen or if the condition fails to improve as anticipated.  Rae Lips, LCSW     09/06/2022   12:18 PM 06/20/2022  9:38 AM 03/07/2022    2:43 PM 02/08/2022    8:46 AM  Depression screen PHQ 2/9  Decreased Interest 1 0 0 0  Down, Depressed, Hopeless 2 0 1 1  PHQ - 2 Score 3 0 1 1  Altered sleeping 0  0 0  Tired, decreased energy 2  1 1   Change in appetite 0  0 1  Feeling bad or failure about yourself  3  1 0  Trouble concentrating 0  0 0  Moving slowly or fidgety/restless 0  0 0  Suicidal thoughts 1   0  PHQ-9 Score 9  3 3   Difficult doing work/chores   Not difficult at all       09/06/2022   12:21 PM 03/07/2022    2:43 PM 02/08/2022    8:48 AM  GAD 7 : Generalized Anxiety Score  Nervous, Anxious, on Edge 1 0 0  Control/stop worrying 3 0 0  Worry too much - different things 0 1 1  Trouble relaxing 1 0 0  Restless 0 0 0  Easily annoyed or irritable 1 1 1   Afraid - awful might happen 1 0 0  Total GAD 7 Score 7 2 2   Anxiety Difficulty  Not difficult at all

## 2022-09-06 ENCOUNTER — Ambulatory Visit (INDEPENDENT_AMBULATORY_CARE_PROVIDER_SITE_OTHER): Payer: Medicaid Other | Admitting: Clinical

## 2022-09-06 DIAGNOSIS — Z658 Other specified problems related to psychosocial circumstances: Secondary | ICD-10-CM

## 2022-09-06 DIAGNOSIS — F4323 Adjustment disorder with mixed anxiety and depressed mood: Secondary | ICD-10-CM

## 2022-09-06 NOTE — Patient Instructions (Addendum)
Center for [facility] at [facility] [address] [place], [place] [address] [phone] (main office) [phone] [facility] office)  Housing Resources                    [employer] (serves [place], [place], [place], [place], [place], [place], [place], [place], [place], [place], [place], [place], and [place] counties) [address] [place], [place] [address] [phone] DeveloperU.ch  **Rental assistance, Home Rehabilitation,Weatherization Assistance Program, Chief Financial Officer, Housing Voucher Program  [employer] for Housing and MetLife Studies: Proofreader Resources to residents of [place], [place], and [place] Make sure you have your documents ready, including:  (Household income verification: 2 months pay stubs, unemployment/social security award letter, statement of no income for all household members over 66) Photo ID for all household members over 18 Utility Bill/Rent Ledger/Lease: must show past due amount for utilities/rent, or the rental agreement if rent is current 2. Start your application online or by paper (in [place] or [place]) at:     [email or web address].[email or web address]  3. Once you have completed the online application, you will get an email confirmation message from the county. Expect to hear back by phone or email at least 6-10 weeks from submitting your application.  4. While you wait:  Call [phone] to check in on your application Let your landlord know that you've applied. Your landlord will be asked to submit documents (W-9) during this application process. Payments will be made directly to the landlord/property management company and utility company Rent or utility assistance for [employer], [place], and [place] Apply at [email or web address] Questions? Call or email Renee at  [phone] or [email or web address]   Eviction Mediation Program: The HOPE Program Https://www.rebuild.BedroomRental.com.cy HOPE Progam serves low-income renters in [address] [place] counties, defined as less than or equal to 80% of the area median income for the county where the renter lives. In the following 12 counties, you should apply to your local rent and utility assistance program INSTEAD OF the HOPE Program: [place], [place], [place], [place], [place], [place], [place], [place], [place], [place], [place], [place]  If you live outside of [place], contact HOPE call center at [phone] to talk to a Program Representative Monday-Friday, 8am-5pm Note that Native American tribes also received federal funding for rent and utility assistance programs. Recognized members of the following tribes will be served by programs managed by tribal governments, including: [place] Indians, [place], [place], [place] of [place] and [place] [facility] Eviction Mediation Coordinator, [place], [phone] [email or web address] .[employer], [place], [phone] [email or web address]   Housing Resources [facility]- [name] [address], [place], [place] [address] [phone] www.gha-[place]ascade.[facility] [address] [place], [place] [address] [phone] PhoneCaptions.ch **Programs include: Hospital doctor and Housing Counseling, Healthy Radiographer, therapeutic, Homeless Prevention and Housing Assistance  Government [facility] [address], [place], [place] [address] [phone] www.PaintingEmporium.co.za **housing applications/recertification; tax payment relief/exemption under specific qualifications  [facility] [address] [place], [place] [address] www.onlinegreensboro.com/~maryshouse **transitional housing for  women in recovery who have minor children or are pregnant  [facility] [address] [place], [place], [place] [address] https://[place]uns[place]-smith.net/  **emergency shelter and support services for families facing homelessness  [facility] [address] [place], [place] [address] [phone] www.youthfocus.org **transitional housing to pregnant women; emergency housing for youth who have run away, are experiencing a family  crisis, are victims of abuse or neglect, or are homeless  [facility] [address], [place], [place] [address] [phone] ircgso.org **Drop-in center for people experiencing homelessness; overnight warming center when temperature is 25 degrees or below  Re-Entry Staffing [address] [place], [place], [place] [address] [phone] https://reentrystaffingagency.org/ **help with affordable housing to people experiencing homelessness or unemployment due to incarceration  [facility] [address], [place], [place] [address] [phone] www.greensborourbanministry.org  **emergency and transitional housing, rent/mortgage assistance, utility assistance  [facility] [address] [place], [place] [address] [phone] www.salvationarmyofgreensboro.org **emergency and transitional housing  Habitat for [employer] [address], [place], [place] [address] [phone] Www.habitatgreensboro.[facility]   [employer] [address], [place], [place] [address] [phone] https://chshousing.org **Home Ownership/Affordable Housing Program and [facility]  Housing Consultants Group [address], [place], [place] [address] [phone] PaidValue.com.cy **home buyer education courses, foreclosure prevention  [facility] [address] [place], [place] [address] [phone] DefMagazine.is **Environmental Exposure Assessment (investigation of homes where either children or pregnant women with a confirmed elevated blood lead level reside)  [facility] [address] [place], [place] [address] [phone] ShowReturn.ca **Home Expense Assistance/Repairs Program; offers home accessibility updates, such as ramps or bars in the bathroom  Self-Help Credit [facility] [address] [place], [place] [address] [phone] https://www.self-help.org/locations/[place]-branch **Offers credit-building and banking services to people unable to use traditional banking   Housing Resources [facility]  Housing Authority- [place] [facility] [address] Cinda Quest [place], [place] [address] [phone] ChatRepair.pl   [facility] [address] [place], [place] [address] [phone]  WrestlingMonthly.pl **housing applications/recertification, emergency and transitional housing  Open [employer] of [employer] [address] [place], [place] [address] [phone] www.odm-hp.org  **emergency and permanent housing; rent/mortgage payment assistance  Habitat for [employer], [place] and [place] [address] [place], [place] [address] [phone] https://russell-walls.com/  Family Services of the [place], [employer] [address] [place], [place], [place] [address] www.familyservice-piedmont.org **emergency shelter for victims of domestic violence and sexual assault  [facility] [address], [place], [place] [address] [phone] www.senior-resources-[place]il[place].org **Home expense assistance/repairs for older adults  Housing Resources [facility] of [place] [address], [place], [place]  [address] [phone] http://cohen-reilly.biz/  **May offer help with minor housing repairs  [facility] [address], [place], [place] [address] [phone] **emergency housing for victims of domestic violence  Housing Resources [place], [place], [place] [facility])  [facility] [address] [place], [place] [address] [phone] www.newrha.[facility] [address] [place], [place] [address] [phone]  [facility] [address], [place], [place] [address] [phone] www.co.[place]oonstone.us **Housing applications/recertification; tax payment relief/exemption w specific qualifications  [facility] Help for Homeless [address] [place], [place] [address] [phone] Http://www.rchelpforhomeless.org/HOME_PAGE.html  HELP, Incorporated [facility] [address], [place], [place] [address] [phone] 24 Hour Crisis Line 8655771661 Hours of Operation: Monday-Friday, 8:30am-5:00pm http://helpincorporated.org **Includes emergency housing for victims of domestic violence  ____________________   [employer] [place] [employer] (GTA) [address] [place], [place], [place] [address] https://www.Steen-Abeytas.gov/departments/transportation/gdot-divisions/[place]-transit-agency-public-transportation-division     Fixed-route bus services, including regional fare cards for PART, [place], [place], and WSTA buses.  Reduced fare bus  ID's available for Medicaid, Medicare, and "orange card" recipients.  SCAT offers curb-to-curb and door-to-door bus services for people with disabilities who are unable to use a fixed-bus route; also offers a shared-ride program.   Helpful tips:  -Routes available online and physical maps available at the main bus hub lobby (each for a specific  route) -Smartphone directions often include bus routes (see the "bus" icon, next to the "car" and "walk" icons) -Routes differ on weekends, evenings and holidays, so plan ahead!  -If you have Medicaid, Medicare, or orange card, plan to obtain a reduced-fare ID to save 50% on rides. Check days and times to obtain an ID, and bring all necessary documents.   [employer] System [place]) [address] [place], [place]. [address], [place], [place] [address] [phone] SpotApps.nl **Fixed-bus route services, and demand response bus service for older adults  Department of Social [facility] [address] [place], [place] [address] [phone] www.MysterySinger.com.cy **Medicaid transportation is available to [facility] recipients who need assistance getting to [facility] medical appointments and providers  [facility] [address] [place] [address], [place], [place] [address] www.cjmedicaltransportation.com  ** Offers non-emergency transportation for medical appointments  [employer]els [address], [place], [place] [address] [phone] www.wheels4hope.org **REFERRAL NEEDED by specific agencies (see website), after meeting specified criteria only  [employer] for [employer]) [address], [place], [place] [address] [phone]  BuyingShow.es  *Regional fixed-bus routes between counties (example: [place] to [place]) and [employer] of [place]  [address], [place], [place] [address] [phone] Http://senior-resources-[place]ford.org  Museum/gallery exhibitions officer available (call or see website for details)  [facility]ior Adults [facility] [facility] [address], [place], [place] [address] [phone] www.senioradults.org  **Ride coordination  _____________________________   [employer] Group   (Childcare options, Early childcare development, etc.) DietDisorder.cz   [employer] Company Child Care Facility Search Engine  https://ncchildcare.http://cook.com/

## 2022-09-11 NOTE — BH Specialist Note (Signed)
Integrated Behavioral Health via Telemedicine Visit  09/21/2022 Brianna Mercado [id number]  Number of Integrated Behavioral Health Clinician visits: 2- Second Visit  Session Start time: 0950   Session End time: 1013  Total time in minutes: 23   Referring Provider: Catalina Antigua, MD Patient/Family location: Home [facility] Provider location: Center for [facility] at [facility]  All persons participating in visit: Patient Brianna Mercado and [facility] Brianna Mercado   Types of Service: Individual psychotherapy and Video visit  I connected with Brianna Mercado and/or Brianna Mercado's  n/a  via  Telephone or Video Enabled Telemedicine Application  (Video is Caregility application) and verified that I am speaking with the correct person using two identifiers. Discussed confidentiality: Yes   I discussed the limitations of telemedicine and the availability of in person appointments.  Discussed there is a possibility of technology failure and discussed alternative modes of communication if that failure occurs.  I discussed that engaging in this telemedicine visit, they consent to the provision of behavioral healthcare and the services will be billed under their insurance.  Patient and/or legal guardian expressed understanding and consented to Telemedicine visit: Yes   Presenting Concerns: Patient and/or family reports the following symptoms/concerns: Needs household items for new housing approved(pots, pans, washer/dryer); will see new place and obtain key tomorrow. Pt stress over having to tell FOB that he's unable to move into new place due to rental agreement; would like couples counseling options.  Duration of problem: Perinatal; Severity of problem: moderate  Patient and/or Family's Strengths/Protective Factors: Social connections, Sense of purpose, and Physical Health (exercise, healthy diet, medication compliance, etc.)  Goals Addressed: Patient will:   Reduce symptoms of: anxiety, depression, and stress   Demonstrate ability to: Increase healthy adjustment to [name]  Progress towards Goals: Ongoing  Interventions: Interventions utilized:  Solution-Focused Strategies and Supportive Reflection Standardized Assessments completed: Not Needed  Patient and/or Family Response: Patient agrees with treatment plan.   Assessment: Patient currently experiencing Adjustment disorder with mixed anxious and depressed mood.   Patient may benefit from continued therapeutic intervention  .  Plan: Follow up with behavioral health clinician on : Three weeks Behavioral recommendations:  -Continue plan to move into new housing tomorrow -Read through [employer]; use as needed -Use psychologytoday.com site to find couple counseling that takes your insurance -Continue plan to attend child's school orientation today Referral(s): Integrated Art gallery manager (In Clinic) and [employer] Resources:  [employer]; couple counseling resource  I discussed the assessment and treatment plan with the patient and/or parent/guardian. They were provided an opportunity to ask questions and all were answered. They agreed with the plan and demonstrated an understanding of the instructions.   They were advised to call back or seek an in-person evaluation if the symptoms worsen or if the condition fails to improve as anticipated.  Rae Lips, LCSW     09/06/2022   12:18 PM 06/20/2022    9:38 AM 03/07/2022    2:43 PM 02/08/2022    8:46 AM  Depression screen PHQ 2/9  Decreased Interest 1 0 0 0  Down, Depressed, Hopeless 2 0 1 1  PHQ - 2 Score 3 0 1 1  Altered sleeping 0  0 0  Tired, decreased energy 2  1 1   Change in appetite 0  0 1  Feeling bad or failure about yourself  3  1 0  Trouble concentrating 0  0 0  Moving slowly or fidgety/restless 0  0  0  Suicidal thoughts 1   0  PHQ-9 Score 9  3 3   Difficult doing  work/chores   Not difficult at all       09/06/2022   12:21 PM 03/07/2022    2:43 PM 02/08/2022    8:48 AM  GAD 7 : Generalized Anxiety Score  Nervous, Anxious, on Edge 1 0 0  Control/stop worrying 3 0 0  Worry too much - different things 0 1 1  Trouble relaxing 1 0 0  Restless 0 0 0  Easily annoyed or irritable 1 1 1   Afraid - awful might happen 1 0 0  Total GAD 7 Score 7 2 2   Anxiety Difficulty  Not difficult at all

## 2022-09-21 ENCOUNTER — Ambulatory Visit (INDEPENDENT_AMBULATORY_CARE_PROVIDER_SITE_OTHER): Payer: Medicaid Other | Admitting: Family Medicine

## 2022-09-21 ENCOUNTER — Encounter: Payer: Self-pay | Admitting: Family Medicine

## 2022-09-21 ENCOUNTER — Ambulatory Visit (INDEPENDENT_AMBULATORY_CARE_PROVIDER_SITE_OTHER): Payer: Medicaid Other | Admitting: Clinical

## 2022-09-21 DIAGNOSIS — F4323 Adjustment disorder with mixed anxiety and depressed mood: Secondary | ICD-10-CM | POA: Diagnosis not present

## 2022-09-21 DIAGNOSIS — Z658 Other specified problems related to psychosocial circumstances: Secondary | ICD-10-CM

## 2022-09-21 DIAGNOSIS — O42013 Preterm premature rupture of membranes, onset of labor within 24 hours of rupture, third trimester: Secondary | ICD-10-CM

## 2022-09-21 DIAGNOSIS — Z975 Presence of (intrauterine) contraceptive device: Secondary | ICD-10-CM

## 2022-09-21 DIAGNOSIS — Z148 Genetic carrier of other disease: Secondary | ICD-10-CM | POA: Diagnosis not present

## 2022-09-21 NOTE — Progress Notes (Addendum)
Post Partum Visit Note  Brianna Mercado is a 22 y.o. [phone] female who presents for a postpartum visit. She is 6 weeks postpartum following a normal spontaneous vaginal delivery.  I have fully reviewed the prenatal and intrapartum course. The delivery was at 36 gestational weeks.  Anesthesia: epidural. Postpartum course has been good. Baby is doing well. Baby is feeding by breast and bottle feeding---Similac Neosure. Bleeding staining only. Bowel function is normal. Bladder function is normal. Patient is not sexually active. Contraception method is IUD. Postpartum depression screening: negative.   The pregnancy intention screening data noted above was reviewed. Potential methods of contraception were discussed. The patient elected to proceed with No data recorded.   Edinburgh Postnatal Depression Scale - 09/21/22 1516       Edinburgh Postnatal Depression Scale:  In the Past 7 Days   I have been able to laugh and see the funny side of things. 0    I have looked forward with enjoyment to things. 0    I have blamed myself unnecessarily when things went wrong. 2    I have been anxious or worried for no good reason. 0    I have felt scared or panicky for no good reason. 2    Things have been getting on top of me. 2    I have been so unhappy that I have had difficulty sleeping. 0    I have felt sad or miserable. 2    I have been so unhappy that I have been crying. 1    The thought of harming myself has occurred to me. 0    Edinburgh Postnatal Depression Scale Total 9             Health Maintenance Due  Topic Date Due   HPV VACCINES (1 - 3-dose series) Never done   COVID-19 Vaccine (1 - 2023-24 season) Never done   INFLUENZA VACCINE  08/31/2022    The following portions of the patient's history were reviewed and updated as appropriate: allergies, current medications, past family history, past medical history, past social history, past surgical history, and problem list.  Review  of Systems Pertinent items are noted in HPI.  Objective:  BP 119/73   Pulse 65   Ht 5\' 4"  (1.626 m)   Wt 163 lb (73.9 kg)   Breastfeeding Yes   BMI 27.98 kg/m    General:  alert, cooperative, and appears stated age   Breasts:  not indicated  Lungs: clear to auscultation bilaterally  Heart:  regular rate and rhythm, S1, S2 normal, no murmur, click, rub or gallop  Abdomen: soft, non-tender; bowel sounds normal; no masses,  no organomegaly   Wound   GU exam:   Normal genital exam, uncut brown IUD strings hanging outside of the vagina        Cut strings for patient under speculum exam.  When making the first attempt to cut patient jumped despite giving verbal warnings of each step prior to being done.  The jumping caused only one string to cut and the other to snag.  No string lengthening noticed on exam however had to snip other string on second attempt w/o incident.  Visualized strings, both 3-5 cm outside of the external cervical os.  No significant bleeding appreciated (patient having staining still per HPI) and patient denies abdominal / pelvic cramping or pain before or after cutting the strings.    Assessment:   Routine Postpartum Visit   Reassuring postpartum exam.  Plan:   Essential components of care per ACOG recommendations:  1.  Mood and well being: Patient with positive depression screening today. Reviewed local resources for support. Saw Asher Muir virtually this morning, has follow up on 9/12 with [facility] as well.  Request to speak to clinic counselor today.  - Patient tobacco use? No.   - hx of drug use? No.    2. Infant care and feeding:  -Patient currently breastmilk feeding? No.  -Social determinants of health (SDOH) reviewed in EPIC. The following needs were identified housing and depression.  -Female infant has been doing well but had feared had seizure a couple weeks ago, went to ED and was told not seizures but has follow up with PEDs Neuro in Oct with repeat EEG per  patient. She states is switching pediatricians but otherwise pediatrician was happy with growth trends and baby's progress thus far.   3. Sexuality, contraception and birth spacing - Patient does not want a pregnancy in the next year.   - Reviewed reproductive life planning. Reviewed contraceptive methods based on pt preferences and effectiveness.  Patient desired continued IUD or IUS today.   - Discussed birth spacing of 18 months  4. Sleep and fatigue -Encouraged family/partner/community support of 4 hrs of uninterrupted sleep to help with mood and fatigue  5. Physical Recovery  - Discussed patients delivery and complications. She describes her labor as good. - Patient had a Vaginal, no problems at delivery. Patient had a  hemostatic periurethral  lacerations. Perineal healing reviewed. Patient expressed understanding - Patient has urinary incontinence? No. - Patient is safe to resume physical and sexual activity  6.  Health Maintenance - HM due items addressed No - only due for influenza vaccine and not currently offering - Last pap smear  Diagnosis  Date Value Ref Range Status  03/07/2022      - Negative for intraepithelial lesion or malignancy (NILM)   Pap smear not done at today's visit.  -Breast Cancer screening indicated? No.   7. Chronic Disease/Pregnancy Condition follow up:  Depression  - PCP follow up  Hessie Dibble, MD Center for [facility], [facility]   Mittie Bodo, MD Family Medicine - Obstetrics Fellow

## 2022-09-21 NOTE — Patient Instructions (Addendum)
Center for [name] Healthcare at [facility] for Women [address] [place], [place] [address] [phone] (main office) [phone] [facility] office)  [facility] [email or web address]  Couple Counseling [email or web address]  *Use the filters for your insurance and couple therapy as specialty

## 2022-09-28 NOTE — Telephone Encounter (Signed)
Hospital discharge call completed. Patient voiced no questions of concerns. Deforest Hoyles, RN

## 2022-09-28 NOTE — BH Specialist Note (Signed)
Integrated Behavioral Health via Telemedicine Visit  10/12/2022 Brianna Mercado [id number]  Number of Integrated Behavioral Health Clinician visits: 3- Third Visit  Session Start time: 1013   Session End time: 1023  Total time in minutes: 10   Referring Provider: Catalina Antigua, MD Patient/Family location: [facility] Provider location: Center for [facility] at [facility] for Women  All persons participating in visit: Patient Brianna Mercado and Brianna Mercado   Types of Service: Individual psychotherapy and Video visit  I connected with Brianna Mercado and/or Brianna Mercado's  n/a  via  Telephone or Video Enabled Telemedicine Application  (Video is Caregility application) and verified that I am speaking with the correct person using two identifiers. Discussed confidentiality: Yes   I discussed the limitations of telemedicine and the availability of in person appointments.  Discussed there is a possibility of technology failure and discussed alternative modes of communication if that failure occurs.  I discussed that engaging in this telemedicine visit, they consent to the provision of behavioral healthcare and the services will be billed under their insurance.  Patient and/or legal guardian expressed understanding and consented to Telemedicine visit: Yes   Presenting Concerns: Patient and/or family reports the following symptoms/concerns: Stressful job search, looking for work-from-home position; hopeful as tonight will spend the first night in her own housing; working with [employer]-affiliated agency to obtain referral to [facility]; no other questions or concerns today; improved mood with move.  Duration of problem: Postpartum increase in stress; Severity of problem: moderate  Patient and/or Family's Strengths/Protective Factors: Social connections, Concrete supports in place (healthy food, safe environments, etc.), Sense of purpose, and  Physical Health (exercise, healthy diet, medication compliance, etc.)  Goals Addressed: Patient will:  Maintain reduction of  symptoms of: anxiety, depression, and stress   Increase knowledge and/or ability of: stress reduction   Demonstrate ability to: Increase healthy adjustment to [name]  Progress towards Goals: Ongoing  Interventions: Interventions utilized:  Supportive Reflection Standardized Assessments completed: GAD-7 and PHQ 9  Patient and/or Family Response: Patient agrees with treatment plan.   Assessment: Patient currently experiencing Adjustment disorder with mixed anxious and depressed mood; Psychosocial stress.   Patient may benefit from continued therapeutic intervention  .  Plan: Follow up with behavioral health clinician on : Call Brianna Mercado at [phone], as needed. Behavioral recommendations:  -Continue prioritizing healthy self-care (regular meals, adequate rest; allowing practical help from supportive friends and family)  -Consider new mom support group as needed at either [email or web address] or [email or web address]  -Continue working with community agency to obtain [place] referral for household items Referral(s): Integrated Art gallery manager (In Clinic) and [employer]:  new mom support  I discussed the assessment and treatment plan with the patient and/or parent/guardian. They were provided an opportunity to ask questions and all were answered. They agreed with the plan and demonstrated an understanding of the instructions.   They were advised to call back or seek an in-person evaluation if the symptoms worsen or if the condition fails to improve as anticipated.  Rae Lips, LCSW     10/12/2022   10:18 AM 09/06/2022   12:18 PM 06/20/2022    9:38 AM 03/07/2022    2:43 PM 02/08/2022    8:46 AM  Depression screen PHQ 2/9  Decreased Interest 1 1 0 0 0  Down, Depressed, Hopeless 0 2 0 1 1  PHQ - 2 Score 1 3 0 1 1   Altered sleeping 0 0  0  0  Tired, decreased energy 3 2  1 1   Change in appetite 0 0  0 1  Feeling bad or failure about yourself  1 3  1  0  Trouble concentrating 0 0  0 0  Moving slowly or fidgety/restless 1 0  0 0  Suicidal thoughts 0 1   0  PHQ-9 Score 6 9  3 3   Difficult doing work/chores    Not difficult at all       10/12/2022   10:20 AM 09/06/2022   12:21 PM 03/07/2022    2:43 PM 02/08/2022    8:48 AM  GAD 7 : Generalized Anxiety Score  Nervous, Anxious, on Edge 0 1 0 0  Control/stop worrying 1 3 0 0  Worry too much - different things 1 0 1 1  Trouble relaxing 0 1 0 0  Restless 0 0 0 0  Easily annoyed or irritable 3 1 1 1   Afraid - awful might happen 0 1 0 0  Total GAD 7 Score 5 7 2 2   Anxiety Difficulty   Not difficult at all

## 2022-10-12 ENCOUNTER — Ambulatory Visit: Payer: Medicaid Other | Admitting: Clinical

## 2022-10-12 DIAGNOSIS — Z658 Other specified problems related to psychosocial circumstances: Secondary | ICD-10-CM

## 2022-10-12 DIAGNOSIS — F4323 Adjustment disorder with mixed anxiety and depressed mood: Secondary | ICD-10-CM

## 2022-10-12 NOTE — Patient Instructions (Signed)
Center for [facility] Healthcare at [facility] for Women [address] [place], [place] [address] [phone] (main office) [phone] (Satori Krabill's office)  [facility] [email or web address]   [place] [employer]  (Childcare options, Early childcare development, etc.) [email or web address].cz  [employer] Child Care Facility Search Engine  [email or web address].[email or web address]/   /[employer] and Websites Here are a few free apps meant to help you to help yourself.  To find, try searching on the internet to see if the app is offered on Apple/Android devices. If your first choice doesn't come up on your device, the good news is that there are many choices! Play around with different apps to see which ones are helpful to you.    Calm This is an app meant to help increase calm feelings. Includes info, strategies, and tools for tracking your feelings.      Calm Harm  This app is meant to help with self-harm. Provides many 5-minute or 15-min coping strategies for doing instead of hurting yourself.       Healthy Minds Health Minds is a problem-solving tool to help deal with emotions and cope with stress you encounter wherever you are.      MindShift This app can help people cope with anxiety. Rather than trying to avoid anxiety, you can make an important shift and face it.      MY3  MY3 features a support system, safety plan and resources with the goal of offering a tool to use in a time of need.       My Life My Voice  This mood journal offers a simple solution for tracking your thoughts, feelings and moods. Animated emoticons can help identify your mood.       Relax Melodies Designed to help with sleep, on this app you can mix sounds and meditations for relaxation.      Smiling Mind Smiling Mind is meditation made easy: it's a simple tool that helps put a smile on your mind.        Stop, Breathe & Think  A  friendly, simple guide for people through meditations for mindfulness and compassion.  Stop, Breathe and Think Kids Enter your current feelings and choose a "mission" to help you cope. Offers videos for certain moods instead of just sound recordings.       Team Orange The goal of this tool is to help teens change how they think, act, and react. This app helps you focus on your own good feelings and experiences.      The [employer] Box The [employer] Box (VHB) contains simple tools to help patients with coping, relaxation, distraction, and positive thinking.
